# Patient Record
Sex: Female | Born: 1985 | Race: White | Hispanic: No | Marital: Married | Smoking: Never smoker
Health system: Southern US, Community
[De-identification: ages and names within clinical notes are randomized; demographics above are authoritative.]

## PROBLEM LIST (undated history)

## (undated) DIAGNOSIS — O99210 Obesity complicating pregnancy, unspecified trimester: Secondary | ICD-10-CM

## (undated) DIAGNOSIS — R112 Nausea with vomiting, unspecified: Secondary | ICD-10-CM

## (undated) DIAGNOSIS — D649 Anemia, unspecified: Secondary | ICD-10-CM

## (undated) DIAGNOSIS — F419 Anxiety disorder, unspecified: Secondary | ICD-10-CM

## (undated) DIAGNOSIS — F32A Depression, unspecified: Secondary | ICD-10-CM

## (undated) DIAGNOSIS — E282 Polycystic ovarian syndrome: Secondary | ICD-10-CM

## (undated) DIAGNOSIS — Z9889 Other specified postprocedural states: Secondary | ICD-10-CM

## (undated) HISTORY — DX: Polycystic ovarian syndrome: E28.2

## (undated) HISTORY — DX: Depression, unspecified: F32.A

## (undated) HISTORY — DX: Anxiety disorder, unspecified: F41.9

---
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
## 2008-05-29 ENCOUNTER — Emergency Department: Payer: Self-pay | Admitting: Emergency Medicine

## 2014-09-23 ENCOUNTER — Encounter: Payer: Self-pay | Admitting: *Deleted

## 2014-09-23 ENCOUNTER — Inpatient Hospital Stay
Admission: RE | Admit: 2014-09-23 | Discharge: 2014-09-29 | DRG: 765 | Disposition: A | Discharge status: Home / Self Care | Payer: Medicaid Other | Source: Ambulatory Visit | Attending: Obstetrics & Gynecology | Admitting: Obstetrics & Gynecology

## 2014-09-23 DIAGNOSIS — Z3490 Encounter for supervision of normal pregnancy, unspecified, unspecified trimester: Secondary | ICD-10-CM

## 2014-09-23 DIAGNOSIS — O99214 Obesity complicating childbirth: Secondary | ICD-10-CM | POA: Diagnosis present

## 2014-09-23 DIAGNOSIS — Z3A39 39 weeks gestation of pregnancy: Secondary | ICD-10-CM | POA: Diagnosis present

## 2014-09-23 DIAGNOSIS — Z6841 Body Mass Index (BMI) 40.0 and over, adult: Secondary | ICD-10-CM

## 2014-09-23 DIAGNOSIS — O99213 Obesity complicating pregnancy, third trimester: Secondary | ICD-10-CM | POA: Diagnosis present

## 2014-09-23 DIAGNOSIS — O339 Maternal care for disproportion, unspecified: Secondary | ICD-10-CM | POA: Diagnosis present

## 2014-09-23 HISTORY — DX: Obesity complicating pregnancy, unspecified trimester: O99.210

## 2014-09-23 LAB — CBC
HCT: 30.7 % — ABNORMAL LOW (ref 35.0–47.0)
Hemoglobin: 9.7 g/dL — ABNORMAL LOW (ref 12.0–16.0)
MCH: 25 pg — AB (ref 26.0–34.0)
MCHC: 31.7 g/dL — AB (ref 32.0–36.0)
MCV: 78.8 fL — ABNORMAL LOW (ref 80.0–100.0)
PLATELETS: 142 10*3/uL — AB (ref 150–440)
RBC: 3.9 MIL/uL (ref 3.80–5.20)
RDW: 18.4 % — ABNORMAL HIGH (ref 11.5–14.5)
WBC: 9 10*3/uL (ref 3.6–11.0)

## 2014-09-23 LAB — ABO/RH: ABO/RH(D): A NEG

## 2014-09-23 LAB — TYPE AND SCREEN
ABO/RH(D): A NEG
ANTIBODY SCREEN: NEGATIVE

## 2014-09-23 MED ORDER — MISOPROSTOL 200 MCG PO TABS
800.0000 ug | ORAL_TABLET | Freq: Once | ORAL | Status: DC | PRN
  Filled 2014-09-23: qty 4

## 2014-09-23 MED ORDER — LACTATED RINGERS IV SOLN: 500.0000 mL | INTRAVENOUS | Status: DC | PRN

## 2014-09-23 MED ORDER — LIDOCAINE HCL (PF) 1 % IJ SOLN
30.0000 mL | INTRAMUSCULAR | Status: DC | PRN
  Filled 2014-09-23: qty 30

## 2014-09-23 MED ORDER — AMMONIA AROMATIC IN INHA: 0.3000 mL | Freq: Once | RESPIRATORY_TRACT | Status: DC | PRN

## 2014-09-23 MED ORDER — ONDANSETRON HCL 4 MG/2ML IJ SOLN
4.0000 mg | Freq: Four times a day (QID) | INTRAMUSCULAR | Status: DC | PRN
  Administered 2014-09-25: 4 mg via INTRAVENOUS

## 2014-09-23 MED ORDER — ZOLPIDEM TARTRATE 5 MG PO TABS
5.0000 mg | ORAL_TABLET | Freq: Every evening | ORAL | Status: DC | PRN
  Administered 2014-09-23: 5 mg via ORAL

## 2014-09-23 MED ORDER — TERBUTALINE SULFATE 1 MG/ML IJ SOLN: 0.2500 mg | Freq: Once | INTRAMUSCULAR | Status: DC | PRN

## 2014-09-23 MED ORDER — OXYTOCIN BOLUS FROM INFUSION: 500.0000 mL | INTRAVENOUS | Status: DC

## 2014-09-23 MED ORDER — LACTATED RINGERS IV SOLN
INTRAVENOUS | Status: DC
  Administered 2014-09-23 – 2014-09-24 (×4): via INTRAVENOUS
  Administered 2014-09-25: 1000 mL via INTRAVENOUS

## 2014-09-23 MED ORDER — ZOLPIDEM TARTRATE 5 MG PO TABS
ORAL_TABLET | ORAL | Status: AC
  Administered 2014-09-23: 5 mg via ORAL
  Filled 2014-09-23: qty 1

## 2014-09-23 MED ORDER — OXYTOCIN 40 UNITS IN LACTATED RINGERS INFUSION - SIMPLE MED
62.5000 mL/h | INTRAVENOUS | Status: DC
  Administered 2014-09-25: 500 mL via INTRAVENOUS
  Administered 2014-09-25: 1000 mL via INTRAVENOUS
  Filled 2014-09-23: qty 1000

## 2014-09-23 MED ORDER — DINOPROSTONE 10 MG VA INST
10.0000 mg | VAGINAL_INSERT | Freq: Once | VAGINAL | Status: AC
  Administered 2014-09-23: 10 mg via VAGINAL
  Filled 2014-09-23: qty 1

## 2014-09-23 NOTE — H&P (Signed)
HISTORY AND PHYSICAL  HISTORY OF PRESENT ILLNESS: Tammy Chase is a 29 y.o. G1P0 with EDC=09/24/2014 at [redacted]w[redacted]d by LMP consistent with a 7 week ultrasound with a pregnancy complicated by obesity (BMI 44) presenting for induction of labor. Prenatal care remarkable also for UTIs , negative first trimester and MSAFP testing, receiving Rhogam at 28 weeks (A negative), normal antepartum testing and growth scans.  She has not had regular contractions and denies leakage of fluid, vaginal bleeding, or decreased fetal movement.     REVIEW OF SYSTEMS: A complete review of systems was performed and was specifically negative for headache, changes in vision, RUQ pain, shortness of breath, chest pain,  and dysuria.   HISTORY:  Past Medical History  Diagnosis Date  . Obesity affecting pregnancy     History reviewed. No pertinent past surgical history.  Current medications: Prenatal vitamins   No Known Allergies  OB History  Gravida Para Term Preterm AB SAB TAB Ectopic Multiple Living  1             # Outcome Date GA Lbr Len/2nd Weight Sex Delivery Anes PTL Lv  1 Current               Gynecologic History:  Pap Smear: NIL 09/25/2013 History of STI: Chlamydia 2009  Social History  Substance Use Topics  . Smoking status: Never Smoker   . Smokeless tobacco: Never Used  . Alcohol Use: No    PHYSICAL EXAM: Temp:  [98 F (36.7 C)] 98 F (36.7 C) (08/17 1934) Pulse Rate:  [87] 87 (08/17 1934) Resp:  [18] 18 (08/17 1934) BP: (122)/(74) 122/74 mmHg (08/17 1934) Weight:  [226 lb (102.513 kg)] 226 lb (102.513 kg) (08/17 1934)  GENERAL: NAD AAOx3 CHEST:CTAB no increased work of breathing CV:RRR no appreciable murmurs ABDOMEN: gravid, nontender, EFW 7 1/2#  by Leopolds EXTREMITIES:  Warm and well-perfused, nontender, mildly edematous, +1 DTRs  CERVIX: closed/ 70%/-1   FHT: 125-130 with accelerations to 150s, moderate variability, no decelerations Toco: occasional, mild  DIAGNOSTIC  STUDIES:  Recent Labs Lab 09/23/14 2120  WBC 9.0  HGB 9.7*  HCT 30.7*  PLT 142*    PRENATAL STUDIES:  Prenatal Labs:  MBT: A negative; Rubella immune, Varicella immune, HIV neg, RPR neg, Hep B neg, GC/CT neg, GBSneg, glucola 97/113  Last [place] [id number] 2556g (29.7%ile) placentafundal, AFI wnl, normal anatomy  ASSESSMENT AND PLAN: IUP at 39.6 weeks for IOL for obesity  1. Fetal Well being  - Fetal Tracing:Cat 1 - Group B Streptococcus:negative - Presentation: vtx confirmed by [place]  On admission  2. Routine OB: - Prenatal labs reviewed, as above - Rh negative  3. Induction of Labor:  -  Contractions mild and irregular with external toco in place -  Plan for induction with Cervidil- Cervidil inserted after explaining risks of hyperstimulation, FITL, failed induction, FTP, Cesarean section and informed consent obtained. 4. Post Partum Planning: - Infant feeding: Breast - Contraception: undecided  Tammy Chase, CNM

## 2014-09-24 MED ORDER — SODIUM CHLORIDE 0.9 % IJ SOLN
INTRAMUSCULAR | Status: AC
  Filled 2014-09-24: qty 6

## 2014-09-24 MED ORDER — MISOPROSTOL 25 MCG QUARTER TABLET
25.0000 ug | ORAL_TABLET | ORAL | Status: DC
  Administered 2014-09-24: 25 ug via ORAL
  Filled 2014-09-24: qty 1

## 2014-09-24 MED ORDER — STERILE WATER FOR INJECTION IJ SOLN
INTRAMUSCULAR | Status: AC
  Filled 2014-09-24: qty 50

## 2014-09-24 NOTE — Plan of Care (Signed)
Attempted to remove cervidil x 3. Unable to do so. t brothers,cnm notified

## 2014-09-24 NOTE — Plan of Care (Signed)
Foley bulb placement attempted by t brothers,cnm. Unable to place foley bulb. Will start cytotec

## 2014-09-24 NOTE — Progress Notes (Signed)
Intrapartum progress note  S:  Patient is comfortable, feeling contractions.  O:  BP 106/43 mmHg  Pulse 79  Temp(Src) 98 F (36.7 C) (Oral)  Resp 18   FHT: 140 mod +accels no decels TOCO: q32min SVE: 3/70/-1 soft, middle  A/P: 29yo G1P0 @ 40.0 with IOL due to maternal morbid obesity (BMI 42) 1. Fetal status: category 1 tracing 2. IOL: s/p cervadil and cytotec x1.  Now contracting spontaneously.  Will continue expectant management for now, and if contractions taper off will add pitocin.   3. Pain relief:  Epidural ok when patient requests. 4. Continuous EFM/TOCO  ----- Ranae Plumber, MD Attending Obstetrician and Gynecologist [facility]

## 2014-09-24 NOTE — Progress Notes (Signed)
Attempted to place foley bulb, unable to dilate bulb above cervical os. Procedure abandoned d/t pt's intolerance. Will plan for Cytotec po.

## 2014-09-24 NOTE — Progress Notes (Signed)
Called to bedside for cervidil removal - removed from vaginal vault.  Cervix 1/60/-2, vertex Category 1 tracing POM options reviewed with pt and s/o including foley bulb or cytotec. Pt opts for foley bulb, will place after pt has showered and had something to eat.

## 2014-09-24 NOTE — Plan of Care (Signed)
Cervidil removed by t brothers,cnm. Will allow pt to get up and shower

## 2014-09-25 ENCOUNTER — Inpatient Hospital Stay: Payer: Medicaid Other | Admitting: Registered Nurse

## 2014-09-25 ENCOUNTER — Inpatient Hospital Stay: Payer: Medicaid Other | Admitting: Certified Registered Nurse Anesthetist

## 2014-09-25 ENCOUNTER — Encounter
Admission: RE | Disposition: A | Discharge status: Home / Self Care | Payer: Self-pay | Source: Ambulatory Visit | Attending: Obstetrics & Gynecology

## 2014-09-25 ENCOUNTER — Encounter: Payer: Self-pay | Admitting: Anesthesiology

## 2014-09-25 LAB — PLATELET COUNT: PLATELETS: 137 10*3/uL — AB (ref 150–440)

## 2014-09-25 LAB — RPR: RPR Ser Ql: NONREACTIVE

## 2014-09-25 LAB — CREATININE, SERUM: CREATININE: 0.79 mg/dL (ref 0.44–1.00)

## 2014-09-25 SURGERY — Surgical Case
Anesthesia: Epidural | Wound class: Clean Contaminated

## 2014-09-25 MED ORDER — MAGNESIUM HYDROXIDE 400 MG/5ML PO SUSP: 30.0000 mL | ORAL | Status: DC | PRN

## 2014-09-25 MED ORDER — LIDOCAINE-EPINEPHRINE (PF) 1.5 %-1:200000 IJ SOLN
INTRAMUSCULAR | Status: DC | PRN
  Administered 2014-09-25: 3 mL via EPIDURAL

## 2014-09-25 MED ORDER — ACETAMINOPHEN 500 MG PO TABS
1000.0000 mg | ORAL_TABLET | Freq: Once | ORAL | Status: AC
  Administered 2014-09-25: 1000 mg via ORAL

## 2014-09-25 MED ORDER — DIPHENHYDRAMINE HCL 25 MG PO CAPS: 25.0000 mg | ORAL_CAPSULE | Freq: Four times a day (QID) | ORAL | Status: DC | PRN

## 2014-09-25 MED ORDER — LANOLIN HYDROUS EX OINT: 1.0000 "application " | TOPICAL_OINTMENT | CUTANEOUS | Status: DC | PRN

## 2014-09-25 MED ORDER — OXYTOCIN 40 UNITS IN LACTATED RINGERS INFUSION - SIMPLE MED
1.0000 m[IU]/min | INTRAVENOUS | Status: DC
  Administered 2014-09-25: 1 m[IU]/min via INTRAVENOUS

## 2014-09-25 MED ORDER — AMPICILLIN SODIUM 2 G IJ SOLR
2.0000 g | Freq: Four times a day (QID) | INTRAMUSCULAR | Status: DC
Start: 2014-09-25 — End: 2014-09-26
  Administered 2014-09-26 (×2): 2 g via INTRAVENOUS
  Filled 2014-09-25 (×4): qty 2000

## 2014-09-25 MED ORDER — DIBUCAINE 1 % RE OINT: 1.0000 "application " | TOPICAL_OINTMENT | RECTAL | Status: DC | PRN

## 2014-09-25 MED ORDER — PRENATAL MULTIVITAMIN CH
1.0000 | ORAL_TABLET | Freq: Every day | ORAL | Status: DC
  Administered 2014-09-26 – 2014-09-28 (×3): 1 via ORAL
  Filled 2014-09-25 (×4): qty 1

## 2014-09-25 MED ORDER — FERROUS SULFATE 325 (65 FE) MG PO TABS
325.0000 mg | ORAL_TABLET | Freq: Every day | ORAL | Status: DC
Start: 2014-09-26 — End: 2014-09-29
  Administered 2014-09-26 – 2014-09-29 (×4): 325 mg via ORAL
  Filled 2014-09-25 (×4): qty 1

## 2014-09-25 MED ORDER — BUTORPHANOL TARTRATE 1 MG/ML IJ SOLN
2.0000 mg | INTRAMUSCULAR | Status: DC | PRN
  Administered 2014-09-25 (×2): 2 mg via INTRAVENOUS
  Filled 2014-09-25: qty 2

## 2014-09-25 MED ORDER — ACETAMINOPHEN 500 MG PO TABS
ORAL_TABLET | ORAL | Status: AC
  Filled 2014-09-25: qty 2

## 2014-09-25 MED ORDER — LACTATED RINGERS IV SOLN
INTRAVENOUS | Status: DC
Start: 2014-09-25 — End: 2014-09-26
  Administered 2014-09-25 – 2014-09-26 (×2): via INTRAVENOUS

## 2014-09-25 MED ORDER — BUPIVACAINE HCL (PF) 0.25 % IJ SOLN
INTRAMUSCULAR | Status: DC | PRN
  Administered 2014-09-25: 4 mL

## 2014-09-25 MED ORDER — FENTANYL CITRATE (PF) 100 MCG/2ML IJ SOLN
INTRAMUSCULAR | Status: AC
  Administered 2014-09-25: 25 ug
  Filled 2014-09-25: qty 2

## 2014-09-25 MED ORDER — CLINDAMYCIN PHOSPHATE 900 MG/50ML IV SOLN
INTRAVENOUS | Status: AC
  Filled 2014-09-25: qty 50

## 2014-09-25 MED ORDER — FENTANYL 2.5 MCG/ML W/ROPIVACAINE 0.2% IN NS 100 ML EPIDURAL INFUSION (ARMC-ANES)
EPIDURAL | Status: AC
  Administered 2014-09-25: 10 mL/h via EPIDURAL
  Filled 2014-09-25: qty 100

## 2014-09-25 MED ORDER — HYDROMORPHONE HCL 1 MG/ML IJ SOLN
0.5000 mg | INTRAMUSCULAR | Status: DC | PRN
  Administered 2014-09-25 (×2): 0.5 mg via INTRAVENOUS
  Filled 2014-09-25 (×2): qty 1

## 2014-09-25 MED ORDER — MENTHOL 3 MG MT LOZG
1.0000 | LOZENGE | OROMUCOSAL | Status: DC | PRN
  Filled 2014-09-25: qty 9

## 2014-09-25 MED ORDER — CLINDAMYCIN PHOSPHATE 900 MG/50ML IV SOLN
900.0000 mg | Freq: Three times a day (TID) | INTRAVENOUS | Status: AC
  Administered 2014-09-25 – 2014-09-26 (×3): 900 mg via INTRAVENOUS
  Filled 2014-09-25 (×2): qty 50

## 2014-09-25 MED ORDER — FENTANYL CITRATE (PF) 100 MCG/2ML IJ SOLN
25.0000 ug | INTRAMUSCULAR | Status: DC | PRN
  Administered 2014-09-25 (×4): 25 ug via INTRAVENOUS

## 2014-09-25 MED ORDER — OXYCODONE-ACETAMINOPHEN 5-325 MG PO TABS
2.0000 | ORAL_TABLET | Freq: Four times a day (QID) | ORAL | Status: DC | PRN
  Administered 2014-09-26 – 2014-09-28 (×5): 2 via ORAL
  Filled 2014-09-25 (×5): qty 2

## 2014-09-25 MED ORDER — WITCH HAZEL-GLYCERIN EX PADS: 1.0000 "application " | MEDICATED_PAD | CUTANEOUS | Status: DC | PRN

## 2014-09-25 MED ORDER — TETANUS-DIPHTH-ACELL PERTUSSIS 5-2.5-18.5 LF-MCG/0.5 IM SUSP: 0.5000 mL | Freq: Once | INTRAMUSCULAR | Status: DC

## 2014-09-25 MED ORDER — FENTANYL CITRATE (PF) 100 MCG/2ML IJ SOLN
INTRAMUSCULAR | Status: DC | PRN
  Administered 2014-09-25 (×2): 50 ug via INTRAVENOUS

## 2014-09-25 MED ORDER — BUTORPHANOL TARTRATE 1 MG/ML IJ SOLN
INTRAMUSCULAR | Status: AC
  Administered 2014-09-25: 2 mg via INTRAVENOUS
  Filled 2014-09-25: qty 2

## 2014-09-25 MED ORDER — IBUPROFEN 600 MG PO TABS
600.0000 mg | ORAL_TABLET | Freq: Four times a day (QID) | ORAL | Status: DC
  Administered 2014-09-26 – 2014-09-29 (×12): 600 mg via ORAL
  Filled 2014-09-25 (×12): qty 1

## 2014-09-25 MED ORDER — POLYETHYLENE GLYCOL 3350 17 G PO PACK
17.0000 g | PACK | Freq: Every day | ORAL | Status: DC
  Administered 2014-09-27 – 2014-09-28 (×2): 17 g via ORAL
  Filled 2014-09-25 (×2): qty 1

## 2014-09-25 MED ORDER — SIMETHICONE 80 MG PO CHEW
80.0000 mg | CHEWABLE_TABLET | Freq: Two times a day (BID) | ORAL | Status: DC
  Administered 2014-09-26 – 2014-09-29 (×7): 80 mg via ORAL
  Filled 2014-09-25 (×7): qty 1

## 2014-09-25 MED ORDER — ACETAMINOPHEN 325 MG PO TABS: 650.0000 mg | ORAL_TABLET | ORAL | Status: DC | PRN

## 2014-09-25 MED ORDER — GENTAMICIN SULFATE 40 MG/ML IJ SOLN
1.5000 mg/kg | Freq: Three times a day (TID) | INTRAVENOUS | Status: DC
  Administered 2014-09-25: 150 mg via INTRAVENOUS
  Filled 2014-09-25 (×4): qty 3.75

## 2014-09-25 MED ORDER — DEXTROSE 5 % IV SOLN
90.0000 mg | Freq: Three times a day (TID) | INTRAVENOUS | Status: DC
  Administered 2014-09-25 – 2014-09-26 (×2): 90 mg via INTRAVENOUS
  Filled 2014-09-25 (×6): qty 2.25

## 2014-09-25 MED ORDER — CITRIC ACID-SODIUM CITRATE 334-500 MG/5ML PO SOLN
ORAL | Status: AC
  Administered 2014-09-25: 14:00:00 via ORAL
  Filled 2014-09-25: qty 15

## 2014-09-25 MED ORDER — MORPHINE SULFATE (PF) 0.5 MG/ML IJ SOLN
INTRAMUSCULAR | Status: DC | PRN
  Administered 2014-09-25: .1 mg via EPIDURAL

## 2014-09-25 MED ORDER — AMPICILLIN SODIUM 2 G IJ SOLR
2.0000 g | Freq: Four times a day (QID) | INTRAMUSCULAR | Status: DC
  Administered 2014-09-25: 2 g via INTRAVENOUS
  Filled 2014-09-25: qty 2000

## 2014-09-25 MED ORDER — OXYTOCIN 40 UNITS IN LACTATED RINGERS INFUSION - SIMPLE MED: 62.5000 mL/h | INTRAVENOUS | Status: DC

## 2014-09-25 MED ORDER — TERBUTALINE SULFATE 1 MG/ML IJ SOLN: 0.2500 mg | Freq: Once | INTRAMUSCULAR | Status: DC | PRN

## 2014-09-25 MED ORDER — ONDANSETRON HCL 4 MG/2ML IJ SOLN: 4.0000 mg | Freq: Once | INTRAMUSCULAR | Status: DC | PRN

## 2014-09-25 MED ORDER — CITRIC ACID-SODIUM CITRATE 334-500 MG/5ML PO SOLN: 30.0000 mL | ORAL | Status: DC

## 2014-09-25 MED ORDER — GENTAMICIN SULFATE 40 MG/ML IJ SOLN
180.0000 mg | Freq: Once | INTRAVENOUS | Status: AC
  Administered 2014-09-25: 180 mg via INTRAVENOUS
  Filled 2014-09-25: qty 4.5

## 2014-09-25 MED ORDER — CEFAZOLIN SODIUM-DEXTROSE 2-3 GM-% IV SOLR: 2.0000 g | INTRAVENOUS | Status: DC

## 2014-09-25 MED ORDER — OXYCODONE-ACETAMINOPHEN 5-325 MG PO TABS
1.0000 | ORAL_TABLET | Freq: Four times a day (QID) | ORAL | Status: DC | PRN
  Administered 2014-09-26 – 2014-09-29 (×7): 1 via ORAL
  Filled 2014-09-25 (×7): qty 1

## 2014-09-25 SURGICAL SUPPLY — 31 items
CANISTER SUCT 3000ML (MISCELLANEOUS) ×3 IMPLANT
CHLORAPREP W/TINT 26ML (MISCELLANEOUS) ×6 IMPLANT
CLOSURE WOUND 1/2 X4 (GAUZE/BANDAGES/DRESSINGS) ×1
DRSG TEGADERM 6X8 (GAUZE/BANDAGES/DRESSINGS) ×6 IMPLANT
DRSG TELFA 3X8 NADH (GAUZE/BANDAGES/DRESSINGS) ×3 IMPLANT
ELECT CAUTERY BLADE 6.4 (BLADE) ×3 IMPLANT
GAUZE SPONGE 4X4 12PLY STRL (GAUZE/BANDAGES/DRESSINGS) ×3 IMPLANT
GLOVE BIO SURGEON STRL SZ7 (GLOVE) ×3 IMPLANT
GLOVE INDICATOR 7.5 STRL GRN (GLOVE) ×3 IMPLANT
GOWN STRL REUS W/ TWL LRG LVL3 (GOWN DISPOSABLE) ×1 IMPLANT
GOWN STRL REUS W/ TWL XL LVL3 (GOWN DISPOSABLE) ×2 IMPLANT
GOWN STRL REUS W/TWL LRG LVL3 (GOWN DISPOSABLE) ×2
GOWN STRL REUS W/TWL XL LVL3 (GOWN DISPOSABLE) ×4
LIQUID BAND (GAUZE/BANDAGES/DRESSINGS) ×3 IMPLANT
NS IRRIG 1000ML POUR BTL (IV SOLUTION) ×3 IMPLANT
PACK C SECTION AR (MISCELLANEOUS) ×3 IMPLANT
PAD GROUND ADULT SPLIT (MISCELLANEOUS) ×3 IMPLANT
PAD OB MATERNITY 4.3X12.25 (PERSONAL CARE ITEMS) ×3 IMPLANT
PAD PREP 24X41 OB/GYN DISP (PERSONAL CARE ITEMS) ×3 IMPLANT
SPONGE LAP 18X18 5 PK (GAUZE/BANDAGES/DRESSINGS) ×3 IMPLANT
STRIP CLOSURE SKIN 1/2X4 (GAUZE/BANDAGES/DRESSINGS) ×2 IMPLANT
SUT MAXON ABS #0 GS21 30IN (SUTURE) ×6 IMPLANT
SUT MNCRL AB 4-0 PS2 18 (SUTURE) ×3 IMPLANT
SUT MON AB 2-0 CT1 36 (SUTURE) ×3 IMPLANT
SUT PLAIN 2 0 (SUTURE) ×4
SUT PLAIN 2 0 XLH (SUTURE) ×6 IMPLANT
SUT PLAIN ABS 2-0 CT1 27XMFL (SUTURE) ×2 IMPLANT
SUT VIC AB 1 CT1 36 (SUTURE) ×6 IMPLANT
SUT VIC AB 2-0 CT1 36 (SUTURE) ×3 IMPLANT
SUT VIC AB 4-0 FS2 27 (SUTURE) ×3 IMPLANT
SYRINGE 10CC LL (SYRINGE) ×3 IMPLANT

## 2014-09-25 NOTE — Discharge Summary (Signed)
Obstetrical Discharge Summary  Date of Admission: 09/23/2014 Date of Discharge: 09/29/2014  Primary OB: [facility]  Gestational Age at Delivery: [redacted]w[redacted]d   Antepartum complications: BMI 44, Rh negative, multiple UTIs Reason for Admission: Induction for BMI >40 Date of Delivery: 09/25/2014  Delivered By: [name]. MD Delivery Type: primary cesarean section, low transverse incision Intrapartum complications/course: Chorio, persistent category II tracing, remote from delivery Anesthesia: epidural Placenta: expressed. Intact: yes. To pathology: yes.  Laceration: none Episiotomy: none Baby: Liveborn female, APGARs8/9, weight 3320 g.   Postpartum course: uncomplicated. Patient received 24hrs of abx post partum Disposition: Home  Rh Immune globulin given: yes Rubella vaccine given: not applicable Tdap vaccine given in AP or PP setting: no Flu vaccine given in AP or PP setting: not applicable  Contraception: patient undecided  Prenatal/Postnatal Panel: A NEG//Rubella Immune//Varicella Immune//RPR negative//HIV negative/HepB Surface Ag negative//pap neg but no EC or TZ seen (date: 2015)//plans to breastfeed  Plan:  Tammy Chase was discharged to home in good condition. Follow-up appointment with Dr Vergie Living in 1 week for an incision check  Discharge Medications:   Medication List    TAKE these medications        ferrous sulfate 325 (65 FE) MG tablet  Take 1 tablet (325 mg total) by mouth 2 (two) times daily with a meal.     ibuprofen 600 MG tablet  Commonly known as:  ADVIL,MOTRIN  Take 1 tablet (600 mg total) by mouth every 6 (six) hours as needed for mild pain or cramping.     multivitamin-prenatal 27-0.8 MG Tabs tablet  Take 1 tablet by mouth daily at 12 noon.     oxyCODONE-acetaminophen 5-325 MG per tablet  Commonly known as:  PERCOCET/ROXICET  Take 1 tablet by mouth every 6 (six) hours as needed for severe pain.     polyethylene glycol packet  Commonly known  as:  MIRALAX / GLYCOLAX  Take 17 g by mouth daily.       [name] MD [facility]  Pager: [phone]

## 2014-09-25 NOTE — Progress Notes (Signed)
L&D Note  09/25/2014 - 8:50 AM  29 y.o. G1 [redacted]w[redacted]d (LMP=7). Pregnancy complicated by BMI 42, Rh negative, h/o UTI  Tammy Chase is admitted for IOL for BMI   Subjective:  Desires epidural  Objective:    Current Vital Signs 24h Vital Sign Ranges  T 97.9 F (36.6 C) Temp  Avg: 98.1 F (36.7 C)  Min: 97.9 F (36.6 C)  Max: 98.2 F (36.8 C)  BP 115/65 mmHg BP  Min: 91/43  Max: 161/142  HR 77 Pulse  Avg: 76.8  Min: 68  Max: 83  RR 18 Resp  Avg: 18  Min: 18  Max: 18  SaO2     No Data Recorded       24 Hour I/O Current Shift I/O  Time Ins Outs       FHR: 135 baseline, occasional accels, rare slight variables, mod var Toco: q2-17m Gen: NAD SVE: 3/70/-1 by Dr. Elesa Massed at 0700.  Labs:   Recent Labs Lab 09/23/14 2120 09/25/14 0821  WBC 9.0  --   HGB 9.7*  --   HCT 30.7*  --   PLT 142* 137*   Medications Current Facility-Administered Medications  Medication Dose Route Frequency Provider Last Rate Last Dose  . fentanyl 2.5 mcg/ml w/ropivacaine 0.2% in normal saline 100 mL EPIDURAL Infusion 2 mcg/ml           . ammonia inhalant 0.3 mL  0.3 mL Inhalation Once PRN Farrel Conners, CNM      . butorphanol (STADOL) injection 2 mg  2 mg Intravenous Q2H PRN Elenora Fender Ward, MD   2 mg at 09/25/14 0607  . lactated ringers infusion 500-1,000 mL  500-1,000 mL Intravenous PRN Farrel Conners, CNM      . lactated ringers infusion   Intravenous Continuous Farrel Conners, CNM 125 mL/hr at 09/24/14 2354    . lidocaine (PF) (XYLOCAINE) 1 % injection 30 mL  30 mL Subcutaneous PRN Farrel Conners, CNM      . misoprostol (CYTOTEC) tablet 25 mcg  25 mcg Oral 6 times per day Marta Antu, CNM   25 mcg at 09/24/14 1407  . misoprostol (CYTOTEC) tablet 800 mcg  800 mcg Rectal Once PRN Farrel Conners, CNM      . ondansetron (ZOFRAN) injection 4 mg  4 mg Intravenous Q6H PRN Farrel Conners, CNM      . oxytocin (PITOCIN) IV BOLUS FROM BAG  500 mL Intravenous Continuous Farrel Conners,  CNM   Stopped at 09/23/14 2146  . oxytocin (PITOCIN) IV infusion 40 units in LR 1000 mL  62.5 mL/hr Intravenous Continuous Farrel Conners, CNM   Stopped at 09/23/14 2146  . oxytocin (PITOCIN) IV infusion 40 units in LR 1000 mL  1-40 milli-units/min Intravenous Titrated Elenora Fender Ward, MD 30 mL/hr at 09/25/14 0705 20 milli-units/min at 09/25/14 0705  . terbutaline (BRETHINE) injection 0.25 mg  0.25 mg Subcutaneous Once PRN Farrel Conners, CNM      . terbutaline (BRETHINE) injection 0.25 mg  0.25 mg Subcutaneous Once PRN Chelsea C Ward, MD      . zolpidem (AMBIEN) tablet 5 mg  5 mg Oral QHS PRN Farrel Conners, CNM   5 mg at 09/23/14 2201    Assessment & Plan:  Pt doing well *IUP: category I tracing currently *IOL: continue pit per protocol, currently at max of 20. -s/p SROM at 0600 today (clear) -Leopolds: 3700gm; 6/21: 53%, normal AC *GBS: neg *Analgesia: anesthesia aware  [name] MD [facility] Pager  [phone]  

## 2014-09-25 NOTE — Progress Notes (Signed)
L&D Note  09/25/2014 - 11:12 AM  29 y.o. G1 [redacted]w[redacted]d (LMP=7). Pregnancy complicated by BMI 42, Rh negative, h/o UTI  [name] is admitted for IOL for BMI   Subjective:  Comfortable s/p epidural placement  Objective:    Current Vital Signs 24h Vital Sign Ranges  T 98.5 F (36.9 C) Temp  Avg: 98.2 F (36.8 C)  Min: 97.9 F (36.6 C)  Max: 98.5 F (36.9 C)  BP (!) 98/47 mmHg BP  Min: 81/33  Max: 161/142  HR 76 Pulse  Avg: 77.2  Min: 68  Max: 83  RR 18 Resp  Avg: 18  Min: 18  Max: 18  SaO2    98/RA No Data Recorded       24 Hour I/O Current Shift I/O  Time Ins Outs       FHR: 135 baseline, occasional accels, rare slight variables, mod var Toco: hard to trace Gen: NAD SVE: IUPC placed; 3-4/75/-2  Recent Labs Lab 09/23/14 2120 09/25/14 0821  WBC 9.0  --   HGB 9.7*  --   HCT 30.7*  --   PLT 142* 137*   Medications Current Facility-Administered Medications  Medication Dose Route Frequency Provider Last Rate Last Dose  . ammonia inhalant 0.3 mL  0.3 mL Inhalation Once PRN Farrel Conners, CNM      . butorphanol (STADOL) injection 2 mg  2 mg Intravenous Q2H PRN Elenora Fender Ward, MD   2 mg at 09/25/14 0607  . lactated ringers infusion 500-1,000 mL  500-1,000 mL Intravenous PRN Farrel Conners, CNM      . lactated ringers infusion   Intravenous Continuous Farrel Conners, CNM 125 mL/hr at 09/24/14 2354    . lidocaine (PF) (XYLOCAINE) 1 % injection 30 mL  30 mL Subcutaneous PRN Farrel Conners, CNM      . misoprostol (CYTOTEC) tablet 25 mcg  25 mcg Oral 6 times per day Marta Antu, CNM   25 mcg at 09/24/14 1407  . misoprostol (CYTOTEC) tablet 800 mcg  800 mcg Rectal Once PRN Farrel Conners, CNM      . ondansetron (ZOFRAN) injection 4 mg  4 mg Intravenous Q6H PRN Farrel Conners, CNM      . oxytocin (PITOCIN) IV BOLUS FROM BAG  500 mL Intravenous Continuous Farrel Conners, CNM   Stopped at 09/23/14 2146  . oxytocin (PITOCIN) IV infusion 40 units in LR  1000 mL  62.5 mL/hr Intravenous Continuous Farrel Conners, CNM   Stopped at 09/23/14 2146  . oxytocin (PITOCIN) IV infusion 40 units in LR 1000 mL  1-40 milli-units/min Intravenous Titrated Elenora Fender Ward, MD 30 mL/hr at 09/25/14 0705 20 milli-units/min at 09/25/14 0705  . terbutaline (BRETHINE) injection 0.25 mg  0.25 mg Subcutaneous Once PRN Farrel Conners, CNM      . terbutaline (BRETHINE) injection 0.25 mg  0.25 mg Subcutaneous Once PRN Chelsea C Ward, MD      . zolpidem (AMBIEN) tablet 5 mg  5 mg Oral QHS PRN Farrel Conners, CNM   5 mg at 09/23/14 2201   Facility-Administered Medications Ordered in Other Encounters  Medication Dose Route Frequency Provider Last Rate Last Dose  . bupivacaine (PF) (MARCAINE) 0.25 % injection    Anesthesia Intra-op Stormy Fabian, CRNA   4 mL at 09/25/14 0918  . lidocaine-EPINEPHrine 1.5 %-1:200000 injection    Anesthesia Intra-op Stormy Fabian, CRNA   3 mL at 09/25/14 0914    Assessment & Plan:  Pt doing well *IUP: category I tracing  currently *IOL: continue pit per protocol, currently at max of 20. Will be able to titrate better with IUPC place; pt was amenable to placement -s/p SROM at 0600 today (clear) -Leopolds: 3700gm; 6/21: 53%, normal AC *GBS: neg *Analgesia: s/p epidural placement  [name] MD [facility] Pager [phone]

## 2014-09-25 NOTE — Progress Notes (Signed)
L&D Note  09/25/2014 - 12:58 PM  29 y.o. G1 [redacted]w[redacted]d (LMP=7). Pregnancy complicated by BMI 42, Rh negative, h/o UTI  Ms. Quinne Pires is admitted for IOL for BMI   Subjective:  Comfortable s/p epidural placement. Variables better after d/c pit and amnioinfusion Objective:    Current Vital Signs 24h Vital Sign Ranges  T 99.3 F (37.4 C) Temp  Avg: 98.4 F (36.9 C)  Min: 97.9 F (36.6 C)  Max: 99.3 F (37.4 C)  BP (!) 97/57 mmHg BP  Min: 81/33  Max: 161/142  HR 81 Pulse  Avg: 77.5  Min: 68  Max: 83  RR 18 Resp  Avg: 18  Min: 18  Max: 18  SaO2    98/RA No Data Recorded       24 Hour I/O Current Shift I/O  Time Ins Outs       FHR: 165 baseline, +accels, occasional variables, mod var Toco: q3-49m Gen: NAD SVE: deffered  Recent Labs Lab 09/23/14 2120 09/25/14 0821  WBC 9.0  --   HGB 9.7*  --   HCT 30.7*  --   PLT 142* 137*   Medications Current Facility-Administered Medications  Medication Dose Route Frequency Provider Last Rate Last Dose  . ammonia inhalant 0.3 mL  0.3 mL Inhalation Once PRN Farrel Conners, CNM      . butorphanol (STADOL) injection 2 mg  2 mg Intravenous Q2H PRN Elenora Fender Ward, MD   2 mg at 09/25/14 0607  . lactated ringers infusion 500-1,000 mL  500-1,000 mL Intravenous PRN Farrel Conners, CNM      . lactated ringers infusion   Intravenous Continuous Farrel Conners, CNM 125 mL/hr at 09/25/14 1143 1,000 mL at 09/25/14 1143  . lidocaine (PF) (XYLOCAINE) 1 % injection 30 mL  30 mL Subcutaneous PRN Farrel Conners, CNM      . misoprostol (CYTOTEC) tablet 25 mcg  25 mcg Oral 6 times per day Marta Antu, CNM   25 mcg at 09/24/14 1407  . misoprostol (CYTOTEC) tablet 800 mcg  800 mcg Rectal Once PRN Farrel Conners, CNM      . ondansetron (ZOFRAN) injection 4 mg  4 mg Intravenous Q6H PRN Farrel Conners, CNM      . oxytocin (PITOCIN) IV BOLUS FROM BAG  500 mL Intravenous Continuous Farrel Conners, CNM   Stopped at 09/23/14 2146  . oxytocin  (PITOCIN) IV infusion 40 units in LR 1000 mL  62.5 mL/hr Intravenous Continuous Farrel Conners, CNM   Stopped at 09/23/14 2146  . oxytocin (PITOCIN) IV infusion 40 units in LR 1000 mL  1-40 milli-units/min Intravenous Titrated Elenora Fender Ward, MD 19.5 mL/hr at 09/25/14 1113 13 milli-units/min at 09/25/14 1113  . terbutaline (BRETHINE) injection 0.25 mg  0.25 mg Subcutaneous Once PRN Farrel Conners, CNM      . terbutaline (BRETHINE) injection 0.25 mg  0.25 mg Subcutaneous Once PRN Chelsea C Ward, MD      . zolpidem (AMBIEN) tablet 5 mg  5 mg Oral QHS PRN Farrel Conners, CNM   5 mg at 09/23/14 2201   Facility-Administered Medications Ordered in Other Encounters  Medication Dose Route Frequency Provider Last Rate Last Dose  . bupivacaine (PF) (MARCAINE) 0.25 % injection    Anesthesia Intra-op Stormy Fabian, CRNA   4 mL at 09/25/14 0918  . lidocaine-EPINEPHrine 1.5 %-1:200000 injection    Anesthesia Intra-op Stormy Fabian, CRNA   3 mL at 09/25/14 0914    Assessment & Plan:  Pt doing well *IUP:  category II tracing; will re bolus with IVF another 1L. Recent temp normal *IOL: may start pit again once category II tracing resolves; reassess cervix in two hours and if no change, may need to d/w pt regading c-section -s/p SROM at 0600 today (clear) -Leopolds: 3700gm; 6/21: 53%, normal AC *GBS: neg *Analgesia: s/p epidural placement  [name] MD [facility] Pager [phone]

## 2014-09-25 NOTE — Discharge Instructions (Signed)
° °Cesarean Delivery, Care After °Refer to this sheet in the next few weeks. These instructions provide you with information on caring for yourself after your procedure. Your health care provider may also give you specific instructions. Your treatment has been planned according to current medical practices, but problems sometimes occur. Call your health care provider if you have any problems or questions after you go home. °HOME CARE INSTRUCTIONS  °· If you have an On-Q pump, remove it on the 5th day after your surgery, by removing the dressing/bandage and pulling the pump out. Cover the site where the pump strings came out with a band-aid, as needed. °· Only take over-the-counter or prescription medications as directed by your health care provider. °· Do not drink alcohol, especially if you are breastfeeding or taking medication to relieve pain. °· Do not  smoke tobacco. °· Continue to use good perineal care. Good perineal care includes: °¨ Wiping your perineum from front to back. °¨ Keeping your perineum clean. °· Check your surgical cut (incision) daily for increased redness, drainage, swelling, or separation of skin. °· Shower and clean your incision gently with soap and water every day, by letting warm and soapy water run over the incision, and then pat it dry. If your health care provider says it is okay, leave the incision uncovered. Use a bandage (dressing) if the incision is draining fluid or appears irritated. If the adhesive strips across the incision do not fall off within 7 days, carefully peel them off, after a shower. °· Hug a pillow when coughing or sneezing until your incision is healed. This helps to relieve pain. °· Do not use tampons, douches or have sexual intercourse, until your health care provider says it is okay. °· Wear a well-fitting bra that provides breast support. °· Limit wearing support panties or control-top hose. °· Drink enough fluids to keep your urine clear or pale  yellow. °· Eat high-fiber foods such as whole grain cereals and breads, brown rice, beans, and fresh fruits and vegetables every day. These foods may help prevent or relieve constipation. °· Resume activities such as climbing stairs, driving, lifting, exercising, or traveling as directed by your health care provider. °· Try to have someone help you with your household activities and your newborn for at least a few days after you leave the hospital. °· Rest as much as possible. Try to rest or take a nap when your newborn is sleeping. °· Increase your activities gradually. °· Do not lift more than 15lbs until directed by a provider. °· Keep all of your scheduled postpartum appointments. It is very important to keep your scheduled follow-up appointments. At these appointments, your health care provider will be checking to make sure that you are healing physically and emotionally. °SEEK MEDICAL CARE IF:  °· You are passing large clots from your vagina. Save any clots to show your health care provider. °· You have a foul smelling discharge from your vagina. °· You have trouble urinating. °· You are urinating frequently. °· You have pain when you urinate. °· You have a change in your bowel movements. °· You have increasing redness, pain, or swelling near your incision. °· You have pus draining from your incision. °· Your incision is separating. °· You have painful, hard, or reddened breasts. °· You have a severe headache. °· You have blurred vision or see spots. °· You feel sad or depressed. °· You have thoughts of hurting yourself or your newborn. °· You have questions about your   care, the care of your newborn, or medications. °· You are dizzy or light-headed. °· You have a rash. °· You have pain, redness, or swelling at the site of the removed intravenous access (IV) tube. °· You have nausea or vomiting. °· You stopped breastfeeding and have not had a menstrual period within 12 weeks of stopping. °· You are not  breastfeeding and have not had a menstrual period within 12 weeks of delivery. °· You have a fever. °SEEK IMMEDIATE MEDICAL CARE IF: °· You have persistent pain. °· You have chest pain. °· You have shortness of breath. °· You faint. °· You have leg pain. °· You have stomach pain. °· Your vaginal bleeding saturates 2 or more sanitary pads in 1 hour. °MAKE SURE YOU:  °· Understand these instructions. °· Will watch your condition. °· Will get help right away if you are not doing well or get worse. °Document Released: 10/15/2001 Document Revised: 06/09/2013 Document Reviewed: 09/20/2011 °[employer]® Patient Information ©2015 [employer]. This information is not intended to replace advice given to you by your health care provider. Make sure you discuss any questions you have with your health care provider. ° ° °

## 2014-09-25 NOTE — Anesthesia Preprocedure Evaluation (Addendum)
Anesthesia Evaluation  Patient identified by MRN, date of birth, ID band Patient awake    Reviewed: Allergy & Precautions, H&P , NPO status , Patient's Chart, lab work & pertinent test results  History of Anesthesia Complications Negative for: history of anesthetic complications  Airway Mallampati: II  TM Distance: >3 FB Neck ROM: full    Dental no notable dental hx.    Pulmonary neg pulmonary ROS,    Pulmonary exam normal       Cardiovascular negative cardio ROS Normal cardiovascular exam    Neuro/Psych negative neurological ROS     GI/Hepatic negative GI ROS, Neg liver ROS,   Endo/Other  negative endocrine ROSMorbid obesity  Renal/GU negative Renal ROS  negative genitourinary   Musculoskeletal   Abdominal   Peds  Hematology negative hematology ROS (+)   Anesthesia Other Findings 1335 Called for C-section.  Reproductive/Obstetrics (+) Pregnancy                           Anesthesia Physical Anesthesia Plan  ASA: II  Anesthesia Plan: Epidural   Post-op Pain Management:    Induction:   Airway Management Planned:   Additional Equipment:   Intra-op Plan:   Post-operative Plan:   Informed Consent:   Plan Discussed with: Anesthesiologist  Anesthesia Plan Comments:         Anesthesia Quick Evaluation

## 2014-09-25 NOTE — Anesthesia Procedure Notes (Signed)
Epidural Patient location during procedure: OB Start time: 09/25/2014 9:08 AM End time: 09/25/2014 9:12 AM  Staffing Resident/CRNA: Stormy Fabian  Preanesthetic Checklist Completed: patient identified, site marked, surgical consent, pre-op evaluation, timeout performed, IV checked, risks and benefits discussed and monitors and equipment checked  Epidural Patient position: sitting Prep: Betadine Patient monitoring: heart rate, continuous pulse ox and blood pressure Approach: midline Location: L3-L4 Injection technique: LOR saline  Needle:  Needle type: Tuohy  Needle gauge: 18 G Needle length: 9 cm and 9 Needle insertion depth: 7.5 cm Catheter type: closed end flexible Catheter size: 20 Guage Catheter at skin depth: 11 cm Test dose: negative and 1.5% lidocaine with Epi 1:200 K  Assessment Events: blood not aspirated, injection not painful, no injection resistance, negative IV test and no paresthesia  Additional Notes   Patient tolerated the insertion well without complications.Reason for block:procedure for pain

## 2014-09-25 NOTE — Transfer of Care (Signed)
Immediate Anesthesia Transfer of Care Note  Patient: Tammy Chase  Procedure(s) Performed: Procedure(s): CESAREAN SECTION (N/A)  Patient Location: [facility]  Anesthesia Type:Epidural  Level of Consciousness: awake, alert  and oriented  Airway & Oxygen Therapy: Patient Spontanous Breathing  Post-op Assessment: Report given to RN and Post -op Vital signs reviewed and stable  Post vital signs: Reviewed and stable  Last Vitals:  Filed Vitals:   09/25/14 1509  BP: 120/63  Pulse: 79  Temp: 37.3 C  Resp: 18    Complications: No apparent anesthesia complications

## 2014-09-25 NOTE — Progress Notes (Signed)
OB Note Two variables with last one down for 36m. Still tachy and contracting q3-40m sve unchanged. D/w mom regarding that I feel it's best to call for c-section. She is amenable to this. OR aware.  [name] MD [facility]  Pager: [phone]

## 2014-09-25 NOTE — Progress Notes (Signed)
ANTIBIOTIC CONSULT NOTE - INITIAL  Pharmacy Consult for Gentamicin Indication: chorioamnionitis  No Known Allergies  Patient Measurements: Height:  (154.9 cm) Weight: 226 lb (102.513 kg) IBW/kg (Calculated) : 47.8 Adjusted Body Weight: 69.7 kg  Vital Signs: Temp: 101.4 F (38.6 C) (08/19 1340) Temp Source: Oral (08/19 1340) BP: 97/57 mmHg (08/19 1155) Pulse Rate: 87 (08/19 1414) Intake/Output from previous day:   Intake/Output from this shift: Total I/O In: 600 [I.V.:600] Out: -   Labs:  Recent Labs  09/23/14 2120 09/25/14 0821  WBC 9.0  --   HGB 9.7*  --   PLT 142* 137*  CREATININE  --  0.79   Estimated Creatinine Clearance: 116.2 mL/min (by C-G formula based on Cr of 0.79). No results for input(s): VANCOTROUGH, VANCOPEAK, VANCORANDOM, GENTTROUGH, GENTPEAK, GENTRANDOM, TOBRATROUGH, TOBRAPEAK, TOBRARND, AMIKACINPEAK, AMIKACINTROU, AMIKACIN in the last 72 hours.   Kinetics:   Ke: 0.242  Vd: 20.9   Microbiology: No results found for this or any previous visit (from the past 720 hour(s)).  Medical History: Past Medical History  Diagnosis Date  . Obesity affecting pregnancy     Medications:  Scheduled:  . acetaminophen      . ampicillin (OMNIPEN) IV  2 g Intravenous 4 times per day  .  ceFAZolin (ANCEF) IV  2 g Intravenous 30 min Pre-Op  . citric acid-sodium citrate  30 mL Oral 30 min Pre-Op  . clindamycin      . clindamycin (CLEOCIN) IV  900 mg Intravenous 3 times per day  . gentamicin  180 mg Intravenous Once  . gentamicin  90 mg Intravenous Q8H  . misoprostol  25 mcg Oral 6 times per day   Infusions:  . lactated ringers 1,000 mL (09/25/14 1143)  . oxytocin 40 units in LR 1000 mL Stopped (09/23/14 2146)  . oxytocin 40 units in LR 1000 mL Stopped (09/23/14 2146)  . oxytocin 40 units in LR 1000 mL 13 milli-units/min (09/25/14 1113)   PRN: ammonia, butorphanol, lactated ringers, lidocaine (PF), misoprostol, ondansetron, terbutaline, terbutaline,  zolpidem  Assessment: 29 y/o F ordered ampicillin, clindamycin, and gentamicin for chorioamnionitis.   Goal of Therapy:  Gentamicin peak level: 4-6 mcg/ml Gentamicin trough level <2 mcg/ml  Plan:  Gentamicin 180 mg iv loading dose then 90 mg iv q 8 hours. Will check a peak and trough with the third dose.   Luisa Hart D 09/25/2014,2:15 PM

## 2014-09-25 NOTE — Progress Notes (Signed)
OB Note RN just rechecked and temp 101.4. apap 1gm, amp and gent.   [name] MD [facility]  Pager: [phone]

## 2014-09-25 NOTE — Op Note (Addendum)
Operative Note   SURGERY DATE: 09/25/2014  PRE-OP DIAGNOSIS:  *Intrauterine pregnancy @ 40/1 weeks *Chorioaminonitis *Failed induction of labor *Arrest of dilation at 3-4cm *Rh negative *BMI 44  POST-OP DIAGNOSIS: Same. Cephalopelvic disproportion   PROCEDURE: primary low transverse cesarean section via pfannenstiel skin incision with double layer uterine closure  SURGEON: Surgeon(s) and Role:    * Lanark Bing, MD - Primary  ASSISTANT: None  ANESTHESIA: epidural  ESTIMATED BLOOD LOSS:  DRAINS: via indwelling foley   TOTAL IV FLUIDS:  VTE Prophylaxis: SCDs pre op  Antibiotics: ampicillin, given within 1 hour of skin incision and clindamycin  intra operatively  SPECIMENS: placenta to path  COMPLICATIONS: none  INDICATIONS: arrest of dilation at 3-4cm and diagnosis of chorio with persistent category II tracing of fetal tachycardia, minimal variability.   FINDINGS: No intra-abdominal adhesions were noted. Grossly normal uterus, tubes and ovaries. clear amniotic fluid, cephalic female infant, weight 3320gm, APGARs 8/9, intact placenta with 3 vessel cord. Pelvis felt tight from above. Moderate caput present.  PROCEDURE IN DETAIL: The patient was taken to the operating room where anesthesia was administered and normal fetal heart tones were confirmed. She was then prepped and draped in the normal fashion in the dorsal supine position with a leftward tilt.  After a time out was performed, a pfannensteil skin incision was made with the scalpel and carried through to the underlying layer of fascia. The fascia was then incised at the midline and this incision was extended laterally with the mayo scissors. Attention was turned to the superior aspect of the fascial incision which was grasped with the kocher clamps x 2, tented up and the rectus muscles were dissected off with the bovie. In a similar fashion the inferior aspect of the fascial incision was grasped  with the kocher clamps, tented up and the rectus muscles dissected off with the mayo scissors. The rectus muscles were then separated in the midline and the peritoneum was entered bluntly. The bladder blade was inserted and the vesicouterine peritoneum was identified, tented up and entered with the metzenbaum scissors. This incision was extended laterally and the bladder flap was created digitally. The bladder blade was reinserted.  A low transverse hysterotomy was made with the scalpel until the endometrial cavity was breached and the amniotic sac ruptured with the Allis clamp, yielding clear amniotic fluid. This incision was extended bluntly and the infant's head, shoulders and body were delivered atraumatically.The cord was clamped x 2 and cut, and the infant was handed to the awaiting pediatricians.  The placenta was then gradually expressed from the uterus and then the uterus was exteriorized and cleared of all clots and debris. The hysterotomy was repaired with a running suture of 1-0 monocryl. A second imbricating layer of 1-0 monocryl suture was then placed. One figure-of-eight suture of 1-0 monocryl were added to achieve excellent hemostasis.   The uterus and adnexa were then returned to the abdomen, and the hysterotomy and all operative sites were reinspected and excellent hemostasis was noted after irrigation and suction of the abdomen with warm saline.  The fascia was reapproximated with 0 vicryl in a simple running fashion. The subcutaneous layer was then reapproximated with interrupted sutures of 2-0 plain gut, and the skin was then closed with 4-0 monocryl, in a subcuticular fashion.  The patient  tolerated the procedure well. Sponge, lap, needle, and instrument counts were correct x 2. The patient was transferred to the recovery room awake, alert and breathing independently in  stable condition.  I will advise the patient at her postoperative appointment to not TOLAC in the future, given  that her pelvis felt narrow from above and the amount of caput on the infant.   She will receive her gentamycin in the PACU and receive triple antibiotics for 24hrs post op.  [name] MD [facility] Pager [phone]

## 2014-09-26 ENCOUNTER — Encounter: Payer: Self-pay | Admitting: Obstetrics and Gynecology

## 2014-09-26 LAB — FETAL SCREEN: FETAL SCREEN: NEGATIVE

## 2014-09-26 LAB — CBC
HCT: 26 % — ABNORMAL LOW (ref 35.0–47.0)
HEMOGLOBIN: 8.3 g/dL — AB (ref 12.0–16.0)
MCH: 24.9 pg — AB (ref 26.0–34.0)
MCHC: 31.8 g/dL — ABNORMAL LOW (ref 32.0–36.0)
MCV: 78.2 fL — ABNORMAL LOW (ref 80.0–100.0)
PLATELETS: 121 10*3/uL — AB (ref 150–440)
RBC: 3.32 MIL/uL — AB (ref 3.80–5.20)
RDW: 18.7 % — ABNORMAL HIGH (ref 11.5–14.5)
WBC: 16.8 10*3/uL — AB (ref 3.6–11.0)

## 2014-09-26 MED ORDER — RHO D IMMUNE GLOBULIN 1500 UNIT/2ML IJ SOSY
300.0000 ug | PREFILLED_SYRINGE | Freq: Once | INTRAMUSCULAR | Status: AC
  Administered 2014-09-26: 300 ug via INTRAMUSCULAR
  Filled 2014-09-26: qty 2

## 2014-09-26 NOTE — Anesthesia Postprocedure Evaluation (Signed)
  Anesthesia Post-op Note  Patient: Tammy Chase  Procedure(s) Performed: Procedure(s): CESAREAN SECTION (N/A)  Anesthesia type:No value filed.  Patient location: PACU  Post pain: Pain level controlled  Post assessment: Post-op Vital signs reviewed, Patient's Cardiovascular Status Stable, Respiratory Function Stable, Patent Airway and No signs of Nausea or vomiting  Post vital signs: Reviewed and stable  Last Vitals:  Filed Vitals:   09/26/14 0255  BP: 105/50  Pulse: 85  Temp: 36.9 C  Resp: 18    Level of consciousness: awake, alert  and patient cooperative  Complications: No apparent anesthesia complications

## 2014-09-26 NOTE — Anesthesia Preprocedure Evaluation (Signed)
Anesthesia Evaluation  Patient identified by MRN, date of birth, ID band Patient awake    Reviewed: Allergy & Precautions, NPO status , Patient's Chart, lab work & pertinent test results, reviewed documented beta blocker date and time   Airway Mallampati: II  TM Distance: >3 FB     Dental  (+) Chipped   Pulmonary           Cardiovascular      Neuro/Psych    GI/Hepatic   Endo/Other    Renal/GU      Musculoskeletal   Abdominal   Peds  Hematology   Anesthesia Other Findings   Reproductive/Obstetrics                             Anesthesia Physical Anesthesia Plan  ASA: II  Anesthesia Plan: Epidural   Post-op Pain Management:    Induction:   Airway Management Planned:   Additional Equipment:   Intra-op Plan:   Post-operative Plan:   Informed Consent: I have reviewed the patients History and Physical, chart, labs and discussed the procedure including the risks, benefits and alternatives for the proposed anesthesia with the patient or authorized representative who has indicated his/her understanding and acceptance.     Plan Discussed with: CRNA  Anesthesia Plan Comments:         Anesthesia Quick Evaluation  

## 2014-09-26 NOTE — Progress Notes (Signed)
Subjective:  Doing well.  Does not report adequate pain control but has only taken on percocet so far.  Minimal lochia.     Objective:   Filed Vitals:   09/25/14 1917 09/25/14 2346 09/26/14 0255 09/26/14 0830  BP: 106/56 108/52 105/50 94/53  Pulse: 85 92 85 82  Temp: 98.6 F (37 C) 99 F (37.2 C) 98.5 F (36.9 C) 98.7 F (37.1 C)  TempSrc: Oral Oral Oral Oral  Resp: 18 18 18 18   Height:      Weight:      SpO2: 99% 97% 98% 96%    Intake/Output Summary (Last 24 hours) at 09/26/14 0949 Last data filed at 09/26/14 0515  Gross per 24 hour  Intake 737.67 ml  Output   1975 ml  Net -1237.33 ml     General: NAD Pulmonary: no increased work of breathing Abdomen: non-distended, non-tender, fundus firm at level of umbilicus Incision: Dressing D/C/I Extremities: no edema, no erythema, no tenderness  Results for orders placed or performed during the hospital encounter of 09/23/14 (from the past 72 hour(s))  CBC     Status: Abnormal   Collection Time: 09/23/14  9:20 PM  Result Value Ref Range   WBC 9.0 3.6 - 11.0 K/uL   RBC 3.90 3.80 - 5.20 MIL/uL   Hemoglobin 9.7 (L) 12.0 - 16.0 g/dL   HCT 30.7 (L) 35.0 - 47.0 %   MCV 78.8 (L) 80.0 - 100.0 fL   MCH 25.0 (L) 26.0 - 34.0 pg   MCHC 31.7 (L) 32.0 - 36.0 g/dL   RDW 18.4 (H) 11.5 - 14.5 %   Platelets 142 (L) 150 - 440 K/uL  RPR     Status: None   Collection Time: 09/23/14  9:20 PM  Result Value Ref Range   RPR Ser Ql Non Reactive Non Reactive    Comment: (NOTE) Performed At: [facility] [place], [place] [id number] Lindon Romp MD ZJ:[id number]   Type and screen     Status: None   Collection Time: 09/23/14  9:21 PM  Result Value Ref Range   ABO/RH(D) A NEG    Antibody Screen NEG    Sample Expiration 09/26/2014   ABO/Rh     Status: None   Collection Time: 09/23/14  9:22 PM  Result Value Ref Range   ABO/RH(D) A NEG   Platelet count     Status: Abnormal   Collection Time: 09/25/14  8:21 AM   Result Value Ref Range   Platelets 137 (L) 150 - 440 K/uL  Creatinine, serum     Status: None   Collection Time: 09/25/14  8:21 AM  Result Value Ref Range   Creatinine, Ser 0.79 0.44 - 1.00 mg/dL   GFR calc non Af Amer >60 >60 mL/min   GFR calc Af Amer >60 >60 mL/min    Comment: (NOTE) The eGFR has been calculated using the CKD EPI equation. This calculation has not been validated in all clinical situations. eGFR's persistently <60 mL/min signify possible Chronic Kidney Disease.   CBC     Status: Abnormal   Collection Time: 09/26/14  6:18 AM  Result Value Ref Range   WBC 16.8 (H) 3.6 - 11.0 K/uL   RBC 3.32 (L) 3.80 - 5.20 MIL/uL   Hemoglobin 8.3 (L) 12.0 - 16.0 g/dL   HCT 26.0 (L) 35.0 - 47.0 %   MCV 78.2 (L) 80.0 - 100.0 fL   MCH 24.9 (L) 26.0 - 34.0 pg  MCHC 31.8 (L) 32.0 - 36.0 g/dL   RDW 18.7 (H) 11.5 - 14.5 %   Platelets 121 (L) 150 - 440 K/uL     Assessment:   29 y.o. G1P1001 postoperativeday #1 1LTCS for NRFS, failed IOL   Plan:  1) Acute blood loss anemia - hemodynamically stable and asymptomatic - po ferrous sulfate - cntinue postoperative care  2) A neg / RI / VZI Information for the patient's newborn:  Caryl, Fate [[id number]]  A POS  - needs rhogam

## 2014-09-26 NOTE — Anesthesia Post-op Follow-up Note (Signed)
  Anesthesia Pain Follow-up Note  Patient: Tammy Chase  Day #: 1  Date of Follow-up: 09/26/2014 Time: 7:36 AM  Last Vitals:  Filed Vitals:   09/26/14 0255  BP: 105/50  Pulse: 85  Temp: 36.9 C  Resp: 18    Level of Consciousness: alert  Pain: none   Side Effects:None  Catheter Site Exam:clean, dry, no drainage  Plan: D/C from anesthesia care  Basilio Cairo

## 2014-09-27 LAB — RHOGAM INJECTION: Unit division: 0

## 2014-09-27 NOTE — Progress Notes (Signed)
  Subjective:  Doing well still minimal ambulation yesterday.  Doing better with pain.  Minimal lochia.   Objective:  Blood pressure 103/63, pulse 66, temperature 98 F (36.7 C), temperature source Oral, resp. rate 18, height _0  (1.549 m), weight 102.513 kg (226 lb), SpO2 95 %, unknown if currently breastfeeding.  General: NAD Pulmonary: no increased work of breathing Abdomen: non-distended, non-tender, fundus firm at level of umbilicus Incision: D/C/I suture line Extremities: no edema, no erythema, no tenderness  Results for orders placed or performed during the hospital encounter of 09/23/14 (from the past 72 hour(s))  Platelet count     Status: Abnormal   Collection Time: 09/25/14  8:21 AM  Result Value Ref Range   Platelets 137 (L) 150 - 440 K/uL  Creatinine, serum     Status: None   Collection Time: 09/25/14  8:21 AM  Result Value Ref Range   Creatinine, Ser 0.79 0.44 - 1.00 mg/dL   GFR calc non Af Amer >60 >60 mL/min   GFR calc Af Amer >60 >60 mL/min    Comment: (NOTE) The eGFR has been calculated using the CKD EPI equation. This calculation has not been validated in all clinical situations. eGFR's persistently <60 mL/min signify possible Chronic Kidney Disease.   Fetal screen     Status: None   Collection Time: 09/26/14  6:18 AM  Result Value Ref Range   Fetal Screen NEG   Rhogam injection     Status: None (Preliminary result)   Collection Time: 09/26/14  6:18 AM  Result Value Ref Range   Unit Number [id number]    Blood Component Type RHIG    Unit division 00    Status of Unit ISSUED    Transfusion Status OK TO TRANSFUSE   CBC     Status: Abnormal   Collection Time: 09/26/14  6:18 AM  Result Value Ref Range   WBC 16.8 (H) 3.6 - 11.0 K/uL   RBC 3.32 (L) 3.80 - 5.20 MIL/uL   Hemoglobin 8.3 (L) 12.0 - 16.0 g/dL   HCT 26.0 (L) 35.0 - 47.0 %   MCV 78.2 (L) 80.0 - 100.0 fL   MCH 24.9 (L) 26.0 - 34.0 pg   MCHC 31.8 (L) 32.0 - 36.0 g/dL   RDW 18.7 (H) 11.5 -  14.5 %   Platelets 121 (L) 150 - 440 K/uL     Assessment:   29 y.o. G1P1001 postoperativeday # 2 1LTCS for NRFS, chorio s/p antibiotics  Plan:  1) Acute blood loss anemia - hemodynamically stable and asymptomatic - po ferrous sulfate  2) --/--/A NEG (08/17 2122)  Information for the patient's newborn:  Itza, [place] Girl Tomasina [[id number]]  A POS - verify rhogam prior to dischage  3) Disposition - anticipate d/c POD3-4

## 2014-09-28 NOTE — Lactation Note (Signed)
This note was copied from the chart of Tammy Keni Wafer. Lactation Consultation Note  Patient Name: Tammy Chase RUEAV'W Date: 09/28/2014 Reason for consult: Difficult latch;Hyperbilirubinemia   Maternal Data  Mom shown how to use SNS to supplement breast milk, difficult to coordinate latch with using sns and nipple shield at first but did well with practice, mom encouraged to pump after every feed to increase milk production, beginning today to produce more in right breast with pumping, obtained 4 cc with pumping at 11:30 am.  THis was given by syringe to baby while nursing with nipple shield  Feeding Feeding Type: Breast Milk with Formula added Length of feed: 30 min  LATCH Score/Interventions Latch: Repeated attempts needed to sustain latch, nipple held in mouth throughout feeding, stimulation needed to elicit sucking reflex. Intervention(s):  (using nipple shield, fussy at breast, until calms) Intervention(s): Adjust position;Assist with latch  Audible Swallowing: Spontaneous and intermittent  Type of Nipple: Flat Intervention(s): Double electric pump (nipple shield)  Comfort (Breast/Nipple): Filling, red/small blisters or bruises, mild/mod discomfort  Problem noted: Mild/Moderate discomfort Interventions (Mild/moderate discomfort): Comfort gels  Hold (Positioning): Assistance needed to correctly position infant at breast and maintain latch.  LATCH Score: 6  Lactation Tools Discussed/Used Tools: Nipple Dorris Carnes;Supplemental Nutrition System Nipple shield size: 20 WIC Program: Yes   Consult Status      Tammy Chase 09/28/2014, 3:11 PM

## 2014-09-28 NOTE — Progress Notes (Signed)
  Subjective:  Doing well, except more swelling. Passing gas and had a BM. Baby under bililights. Workign with LC-had sore nipples and not pumping very much milk yet. Has been syringe feeding and tried S&S.   Objective:  Blood pressure 115/64, pulse 69, temperature 98 F (36.7 C), temperature source Oral, resp. rate 16, height  (1.549 m), weight 226 lb (102.513 kg), SpO2 96 %, unknown if currently breastfeeding.  General: NAD, comfortable, tolerating a regular diet Heart: RRR without murmur Pulmonary: no increased work of breathing, CTA Abdomen: non-distended, non-tender, obese Incision: steristrips intact, C=D. No erythema Extremities:  Edema of LE and feet, no erythema, no tenderness  Results for orders placed or performed during the hospital encounter of 09/23/14 (from the past 72 hour(s))  Fetal screen     Status: None   Collection Time: 09/26/14  6:18 AM  Result Value Ref Range   Fetal Screen NEG   Rhogam injection     Status: None   Collection Time: 09/26/14  6:18 AM  Result Value Ref Range   Unit Number [id number]    Blood Component Type RHIG    Unit division 00    Status of Unit ISSUED,FINAL    Transfusion Status OK TO TRANSFUSE   CBC     Status: Abnormal   Collection Time: 09/26/14  6:18 AM  Result Value Ref Range   WBC 16.8 (H) 3.6 - 11.0 K/uL   RBC 3.32 (L) 3.80 - 5.20 MIL/uL   Hemoglobin 8.3 (L) 12.0 - 16.0 g/dL   HCT 11.9 (L) 14.7 - 82.9 %   MCV 78.2 (L) 80.0 - 100.0 fL   MCH 24.9 (L) 26.0 - 34.0 pg   MCHC 31.8 (L) 32.0 - 36.0 g/dL   RDW 56.2 (H) 13.0 - 86.5 %   Platelets 121 (L) 150 - 440 K/uL     Assessment:   29 y.o. G1P1001 postoperativeday #3-stable  Not ready for discharge  Plan:  1) Acute blood loss anemia - hemodynamically stable and asymptomatic - continue po ferrous sulfate and vitamins  2)A negative-received Rhogam on 8/20 RI / Varicella Immune  3) TDAP status -UTD  4) Breast and supplements  5) Disposition-probable discharge  tomorrow Farrel Conners, CNM

## 2014-09-28 NOTE — Anesthesia Postprocedure Evaluation (Signed)
  Anesthesia Post-op Note  Patient: Tammy Chase  Procedure(s) Performed: epidural  Anesthesia type:epidural  Patient location: PACU  Post pain: Pain level controlled  Post assessment: Post-op Vital signs reviewed, Patient's Cardiovascular Status Stable, Respiratory Function Stable, Patent Airway and No signs of Nausea or vomiting  Post vital signs: Reviewed and stable  Last Vitals:  Filed Vitals:   09/28/14 0716  BP: 107/48  Pulse: 76  Temp: 36.6 C  Resp: 14    Level of consciousness: awake, alert  and patient cooperative  Complications: No apparent anesthesia complications

## 2014-09-29 LAB — SURGICAL PATHOLOGY

## 2014-09-29 MED ORDER — FERROUS SULFATE 325 (65 FE) MG PO TABS: 325.0000 mg | ORAL_TABLET | Freq: Two times a day (BID) | ORAL | Status: DC

## 2014-09-29 MED ORDER — POLYETHYLENE GLYCOL 3350 17 G PO PACK: 17.0000 g | PACK | Freq: Every day | ORAL | Status: DC

## 2014-09-29 MED ORDER — IBUPROFEN 600 MG PO TABS: 600.0000 mg | ORAL_TABLET | Freq: Four times a day (QID) | ORAL | Status: DC | PRN

## 2014-09-29 MED ORDER — OXYCODONE-ACETAMINOPHEN 5-325 MG PO TABS: 1.0000 | ORAL_TABLET | Freq: Four times a day (QID) | ORAL | Status: DC | PRN

## 2014-09-29 NOTE — Progress Notes (Signed)
Patient understands all discharge instructions and the need to make follow up appointments. Patient discharge via wheelchair with auxillary. 

## 2014-09-29 NOTE — Progress Notes (Signed)
Daily Post Partum Note  Tammy Chase is a 29 y.o. G1P1001  POD#4 s/p pLTCS @ [redacted]w[redacted]d .  Pregnancy c/b chorio, BMI 44, h/o UTIs, Rh negative  24hr/overnight events:  none  Subjective:  Meeting all PO goals including BM  Objective:    Current Vital Signs 24h Vital Sign Ranges  T 98 F (36.7 C) Temp  Avg: 98.1 F (36.7 C)  Min: 98 F (36.7 C)  Max: 98.2 F (36.8 C)  BP (!) 102/45 mmHg BP  Min: 102/45  Max: 115/64  HR 77 Pulse  Avg: 72.8  Min: 69  Max: 77  RR 18 Resp  Avg: 17.5  Min: 16  Max: 18  SaO2 97 % Not Delivered SpO2  Avg: 98.3 %  Min: 97 %  Max: 99 %       24 Hour I/O Current Shift I/O  Time Ins Outs        General: NAD Abdomen: +BS, obese, soft, ND. C/d/i with steri strips in place Perineum: deferred Skin:  Warm and dry.  Cardiovascular:Regular rate and rhythm. Respiratory:  Clear to auscultation bilateral. Normal respiratory effort Extremities: no c/c/e  Medications Current Facility-Administered Medications  Medication Dose Route Frequency Provider Last Rate Last Dose  . acetaminophen (TYLENOL) tablet 650 mg  650 mg Oral Q4H PRN [place] Bing, MD      . witch hazel-glycerin (TUCKS) pad 1 application  1 application Topical PRN [place] Bing, MD       And  . dibucaine (NUPERCAINAL) 1 % rectal ointment 1 application  1 application Rectal PRN [place] Bing, MD      . diphenhydrAMINE (BENADRYL) capsule 25 mg  25 mg Oral Q6H PRN [place] Bing, MD      . ferrous sulfate tablet 325 mg  325 mg Oral Q breakfast [place] Bing, MD   325 mg at 09/29/14 0724  . ibuprofen (ADVIL,MOTRIN) tablet 600 mg  600 mg Oral 4 times per day [place] Bing, MD   600 mg at 09/29/14 0518  . lanolin ointment 1 application  1 application Topical PRN [place] Bing, MD      . magnesium hydroxide (MILK OF MAGNESIA) suspension 30 mL  30 mL Oral Q3 days PRN [place] Bing, MD      . menthol-cetylpyridinium (CEPACOL) lozenge 3 mg  1 lozenge Oral Q2H PRN [place] Bing, MD      .  oxyCODONE-acetaminophen (PERCOCET/ROXICET) 5-325 MG per tablet 1 tablet  1 tablet Oral Q6H PRN [place] Bing, MD   1 tablet at 09/29/14 0518  . oxyCODONE-acetaminophen (PERCOCET/ROXICET) 5-325 MG per tablet 2 tablet  2 tablet Oral Q6H PRN [place] Bing, MD   2 tablet at 09/28/14 1302  . polyethylene glycol (MIRALAX / GLYCOLAX) packet 17 g  17 g Oral Daily [place] Bing, MD   17 g at 09/28/14 2332  . prenatal multivitamin tablet 1 tablet  1 tablet Oral Q1200 [place] Bing, MD   1 tablet at 09/28/14 1728  . simethicone (MYLICON) chewable tablet 80 mg  80 mg Oral BID WC [place] Bing, MD   80 mg at 09/29/14 0724  . Tdap (BOOSTRIX) injection 0.5 mL  0.5 mL Intramuscular Once [place] Bing, MD         Recent Labs Lab 09/23/14 2120 09/25/14 0821 09/26/14 0618  WBC 9.0  --  16.8*  HGB 9.7*  --  8.3*  HCT 30.7*  --  26.0*  PLT 142* 137* 121*     Assessment & Plan:  Pt doing well *  Postpartum/postop: routine care. Iron for asymptomatic anemia *Rh neg: s/p rhogam already *Dispo: later today  A NEG / Rubella Immune / Varicella Immune/  RPR negative / HIV negative / HepBsAg negative / Tdap UTD: Yes/pap neg 2015 / Breast  / Contraception: pt unsure / Follow up: Johnsie Kindred. MD [facility] Pager [phone]

## 2014-09-29 NOTE — Lactation Note (Signed)
This note was copied from the chart of Tammy Chase. Lactation Consultation Note  Patient Name: Tammy Chase Today's Date: 09/29/2014     Maternal Data  Mom has not pumped in over 14 hours. She is being discharged with baby today. She and LCs (including me this morning) have tried to contact WIC so she can get pump from them. No answer yet. I reviewed pump options from [facility]. Mom chose to leave here with her pump kit and go to WIC in person to get electric pump. She plans to come back to hospital to rent if needed. I also instructed her on how to use double pump with manual piece. She is mostly bottle feeding formula and declined LC help with breastfeeding today.  Feeding Feeding Type: Bottle Fed - Formula Nipple Type: Slow - flow  LATCH Score/Interventions                      Lactation Tools Discussed/Used     Consult Status       [place]  09/29/2014, 11:58 AM    

## 2014-10-14 NOTE — Addendum Note (Signed)
Addendum  created 10/14/14 4098 by Berdine Addison, MD   Modules edited: Notes Section   Notes Section:  Delete: [id number]

## 2016-11-21 DIAGNOSIS — N926 Irregular menstruation, unspecified: Secondary | ICD-10-CM | POA: Insufficient documentation

## 2016-11-21 DIAGNOSIS — Z0000 Encounter for general adult medical examination without abnormal findings: Secondary | ICD-10-CM | POA: Diagnosis not present

## 2016-11-21 DIAGNOSIS — Z23 Encounter for immunization: Secondary | ICD-10-CM | POA: Diagnosis not present

## 2016-11-21 DIAGNOSIS — D649 Anemia, unspecified: Secondary | ICD-10-CM | POA: Diagnosis not present

## 2017-02-21 DIAGNOSIS — E282 Polycystic ovarian syndrome: Secondary | ICD-10-CM | POA: Diagnosis not present

## 2017-03-07 DIAGNOSIS — E282 Polycystic ovarian syndrome: Secondary | ICD-10-CM | POA: Diagnosis not present

## 2017-03-16 DIAGNOSIS — N970 Female infertility associated with anovulation: Secondary | ICD-10-CM | POA: Diagnosis not present

## 2017-03-25 DIAGNOSIS — N970 Female infertility associated with anovulation: Secondary | ICD-10-CM | POA: Diagnosis not present

## 2017-04-03 DIAGNOSIS — N970 Female infertility associated with anovulation: Secondary | ICD-10-CM | POA: Diagnosis not present

## 2017-04-05 DIAGNOSIS — N970 Female infertility associated with anovulation: Secondary | ICD-10-CM | POA: Diagnosis not present

## 2017-11-28 DIAGNOSIS — N970 Female infertility associated with anovulation: Secondary | ICD-10-CM | POA: Diagnosis not present

## 2018-04-15 DIAGNOSIS — N970 Female infertility associated with anovulation: Secondary | ICD-10-CM | POA: Diagnosis not present

## 2018-07-10 DIAGNOSIS — N970 Female infertility associated with anovulation: Secondary | ICD-10-CM | POA: Diagnosis not present

## 2018-07-18 DIAGNOSIS — Z20828 Contact with and (suspected) exposure to other viral communicable diseases: Secondary | ICD-10-CM | POA: Diagnosis not present

## 2018-08-01 ENCOUNTER — Other Ambulatory Visit: Payer: Self-pay

## 2018-08-01 ENCOUNTER — Encounter: Payer: Self-pay | Admitting: Family Medicine

## 2018-08-01 ENCOUNTER — Ambulatory Visit: Payer: BC Managed Care – PPO | Admitting: Family Medicine

## 2018-08-01 VITALS — BP 120/62 | HR 80 | Ht 61.0 in | Wt 230.0 lb

## 2018-08-01 DIAGNOSIS — Z7689 Persons encountering health services in other specified circumstances: Secondary | ICD-10-CM | POA: Diagnosis not present

## 2018-08-01 NOTE — Progress Notes (Signed)
Date:  08/01/2018   Name:  Tammy Chase   DOB:  08/15/1985   MRN:  [id number]   Chief Complaint: Establish Care (hasn't had a PCP- need for acute issues)  Patient is a 33 year old female who presents for a comprehensive physical exam. The patient reports the following problems: none. Health maintenance has been reviewed up to date.   Review of Systems  Constitutional: Negative.  Negative for chills, fatigue, fever and unexpected weight change.  HENT: Negative for congestion, ear discharge, ear pain, rhinorrhea, sinus pressure, sneezing and sore throat.   Eyes: Negative for photophobia, pain, discharge, redness and itching.  Respiratory: Negative for cough, shortness of breath, wheezing and stridor.   Gastrointestinal: Negative for abdominal pain, blood in stool, constipation, diarrhea, nausea and vomiting.  Endocrine: Negative for cold intolerance, heat intolerance, polydipsia, polyphagia and polyuria.  Genitourinary: Negative for dysuria, flank pain, frequency, hematuria, menstrual problem, pelvic pain, urgency, vaginal bleeding and vaginal discharge.  Musculoskeletal: Negative for arthralgias, back pain and myalgias.  Skin: Negative for rash.  Allergic/Immunologic: Negative for environmental allergies and food allergies.  Neurological: Negative for dizziness, weakness, light-headedness, numbness and headaches.  Hematological: Negative for adenopathy. Does not bruise/bleed easily.  Psychiatric/Behavioral: Negative for dysphoric mood. The patient is not nervous/anxious.     Patient Active Problem List   Diagnosis Date Noted  . Infertility, anovulation 03/16/2017  . Irregular menstrual bleeding 11/21/2016  . Pregnancy 09/23/2014  . Obesity affecting pregnancy in third trimester, antepartum 09/23/2014  . Indication for care in labor or delivery 09/23/2014    No Known Allergies  Past Surgical History:  Procedure Laterality Date  . CESAREAN SECTION N/A 09/25/2014   Procedure:  CESAREAN SECTION;  Surgeon: Cearfoss Bingharlie Pickens, MD;  Location: [facility] ORS;  Service: Obstetrics;  Laterality: N/A;    Social History   Tobacco Use  . Smoking status: Never Smoker  . Smokeless tobacco: Never Used  Substance Use Topics  . Alcohol use: Yes    Comment: occasional  . Drug use: No     Medication list has been reviewed and updated.  Current Meds  Medication Sig  . ibuprofen (ADVIL,MOTRIN) 600 MG tablet Take 1 tablet (600 mg total) by mouth every 6 (six) hours as needed for mild pain or cramping.  Marland Kitchen. letrozole (FEMARA) 2.5 MG tablet Take by mouth. Dr Madaline GuthrieSteiner  . medroxyPROGESTERone (PROVERA) 10 MG tablet Take by mouth. Dr Madaline GuthrieSteiner  . Prenatal Vit-Fe Fumarate-FA (MULTIVITAMIN-PRENATAL) 27-0.8 MG TABS tablet Take 1 tablet by mouth daily at 12 noon.    PHQ 2/9 Scores 08/01/2018  PHQ - 2 Score 0  PHQ- 9 Score 2    BP Readings from Last 3 Encounters:  08/01/18 120/62  09/29/14 112/67    Physical Exam Vitals signs and nursing note reviewed. Exam conducted with a chaperone present.  Constitutional:      Appearance: She is well-developed.  HENT:     Head: Normocephalic.     Right Ear: Tympanic membrane, ear canal and external ear normal.     Left Ear: Tympanic membrane, ear canal and external ear normal.  Eyes:     General: Lids are everted, no foreign bodies appreciated. No scleral icterus.       Left eye: No foreign body or hordeolum.     Conjunctiva/sclera: Conjunctivae normal.     Right eye: Right conjunctiva is not injected.     Left eye: Left conjunctiva is not injected.     Pupils: Pupils are equal, round,  and reactive to light.  Neck:     Musculoskeletal: Normal range of motion and neck supple.     Thyroid: No thyromegaly.     Vascular: No JVD.     Trachea: No tracheal deviation.  Cardiovascular:     Rate and Rhythm: Normal rate and regular rhythm.     Heart sounds: Normal heart sounds. No murmur. No friction rub. No gallop.   Pulmonary:     Effort:  Pulmonary effort is normal. No respiratory distress.     Breath sounds: Normal breath sounds. No wheezing or rales.  Abdominal:     General: Bowel sounds are normal.     Palpations: Abdomen is soft. There is no mass.     Tenderness: There is no abdominal tenderness. There is no guarding or rebound.  Musculoskeletal: Normal range of motion.        General: No tenderness.  Lymphadenopathy:     Cervical: No cervical adenopathy.  Skin:    General: Skin is warm.     Findings: No rash.  Neurological:     Mental Status: She is alert and oriented to person, place, and time.     Cranial Nerves: No cranial nerve deficit.     Deep Tendon Reflexes: Reflexes normal.  Psychiatric:        Mood and Affect: Mood is not anxious or depressed.     Wt Readings from Last 3 Encounters:  08/01/18 230 lb (104.3 kg)  09/23/14 226 lb (102.5 kg)    BP 120/62   Pulse 80   Ht 5\' 1"  (1.549 m)   Wt 230 lb (104.3 kg)   LMP 08/01/2018 (Exact Date)   Breastfeeding No   BMI 43.46 kg/m   Assessment and Plan: 1. Establishing care with new doctor, encounter for Patient establishes care with new physician. Patient is to discuss PCOS with tilted physician for referral to GYN.  I have been unable to evaluate previous ultrasound to determine diagnosis.  In the meantime we had a discussion about unwanted hair removal by electrolysis.

## 2018-08-01 NOTE — Patient Instructions (Signed)
Polycystic Ovarian Syndrome    Polycystic ovarian syndrome (PCOS) is a common hormonal disorder among women of reproductive age. In most women with PCOS, many small fluid-filled sacs (cysts) grow on the ovaries, and the cysts are not part of a normal menstrual cycle. PCOS can cause problems with your menstrual periods and make it difficult to get pregnant. It can also cause an increased risk of miscarriage with pregnancy. If it is not treated, PCOS can lead to serious health problems, such as diabetes and heart disease.  What are the causes?  The cause of PCOS is not known, but it may be the result of a combination of certain factors, such as:  · Irregular menstrual cycle.  · High levels of certain hormones (androgens).  · Problems with the hormone that helps to control blood sugar (insulin resistance).  · Certain genes.  What increases the risk?  This condition is more likely to develop in women who have a family history of PCOS.  What are the signs or symptoms?  Symptoms of PCOS may include:  · Multiple ovarian cysts.  · Infrequent periods or no periods.  · Periods that are too frequent or too heavy.  · Unpredictable periods.  · Inability to get pregnant (infertility) because of not ovulating.  · Increased growth of hair on the face, chest, stomach, back, thumbs, thighs, or toes.  · Acne or oily skin. Acne may develop during adulthood, and it may not respond to treatment.  · Pelvic pain.  · Weight gain or obesity.  · Patches of thickened and dark brown or black skin on the neck, arms, breasts, or thighs (acanthosis nigricans).  · Excess hair growth on the face, chest, abdomen, or upper thighs (hirsutism).  How is this diagnosed?  This condition is diagnosed based on:  · Your medical history.  · A physical exam, including a pelvic exam. Your health care provider may look for areas of increased hair growth on your skin.  · Tests, such as:  ? Ultrasound. This may be used to examine the ovaries and the lining of the  uterus (endometrium) for cysts.  ? Blood tests. These may be used to check levels of sugar (glucose), female hormone (testosterone), and female hormones (estrogen and progesterone) in your blood.  How is this treated?  There is no cure for PCOS, but treatment can help to manage symptoms and prevent more health problems from developing. Treatment varies depending on:  · Your symptoms.  · Whether you want to have a baby or whether you need birth control (contraception).  Treatment may include nutrition and lifestyle changes along with:  · Progesterone hormone to start a menstrual period.  · Birth control pills to help you have regular menstrual periods.  · Medicines to make you ovulate, if you want to get pregnant.  · Medicine to reduce excessive hair growth.  · Surgery, in severe cases. This may involve making small holes in one or both of your ovaries. This decreases the amount of testosterone that your body produces.  Follow these instructions at home:  · Take over-the-counter and prescription medicines only as told by your health care provider.  · Follow a healthy meal plan. This can help you reduce the effects of PCOS.  ? Eat a healthy diet that includes lean proteins, complex carbohydrates, fresh fruits and vegetables, low-fat dairy products, and healthy fats. Make sure to eat enough fiber.  · If you are overweight, lose weight as told by your health care   provider.  ? Losing 10% of your body weight may improve symptoms.  ? Your health care provider can determine how much weight loss is best for you and can help you lose weight safely.  · Keep all follow-up visits as told by your health care provider. This is important.  Contact a health care provider if:  · Your symptoms do not get better with medicine.  · You develop new symptoms.  This information is not intended to replace advice given to you by your health care provider. Make sure you discuss any questions you have with your health care provider.  Document  Released: 05/19/2004 Document Revised: 09/21/2015 Document Reviewed: 07/11/2015  Elsevier Interactive Patient Education © 2019 Elsevier Inc[employer]          Diet for Polycystic Ovary Syndrome  Polycystic ovary syndrome (PCOS) is a disorder of the chemicals (hormones) that regulate a woman's reproductive system, including monthly periods (menstruation). The condition causes important hormones to be out of balance. PCOS can:  · Stop your periods or make them irregular.  · Cause cysts to develop on your ovaries.  · Make it difficult to get pregnant.  · Stop your body from responding to the effects of insulin (insulin resistance). Insulin resistance can lead to obesity and diabetes.  Changing what you eat can help you manage PCOS and improve your health. Following a balanced diet can help you lose weight and improve the way that your body uses insulin.  What are tips for following this plan?  · Follow a balanced diet for meals and snacks. Eat breakfast, lunch, dinner, and one or two snacks every day.  · Include protein in each meal and snack.  · Choose whole grains instead of products that are made with refined flour.  · Eat a variety of foods.  · Exercise regularly as told by your health care provider. Aim to do 30 or more minutes of exercise on most days of the week.  · If you are overweight or obese:  ? Pay attention to how many calories you eat. Cutting down on calories can help you lose weight.  ? Work with your health care provider or a diet and nutrition specialist (dietitian) to figure out how many calories you need each day.  What foods can I eat?    Fruits  Include a variety of colors and types. All fruits are helpful for PCOS.  Vegetables  Include a variety of colors and types. All vegetables are helpful for PCOS.  Grains  Whole grains, such as whole wheat. Whole-grain breads, crackers, cereals, and pasta. Unsweetened oatmeal, bulgur, barley, quinoa, and brown rice. Tortillas made from corn or whole-wheat  flour.  Meats and other proteins  Low-fat (lean) proteins, such as fish, chicken, beans, eggs, and tofu.  Dairy  Low-fat dairy products, such as skim milk, cheese sticks, and yogurt.  Beverages  Low-fat or fat-free drinks, such as water, low-fat milk, sugar-free drinks, and small amounts of 100% fruit juice.  Seasonings and condiments  Ketchup. Mustard. Barbecue sauce. Relish. Low-fat or fat-free mayonnaise.  Fats and oils  Olive oil or canola oil. Walnuts and almonds.  The items listed above may not be a complete list of recommended foods and beverages. Contact a dietitian for more options.  What foods are not recommended?  Foods that are high in calories or fat. Fried foods. Sweets. Products that are made from refined white flour, including white bread, pastries, white rice, and pasta.  The items listed above   may not be a complete list of foods and beverages to avoid. Contact a dietitian for more information.  Summary  · PCOS is a hormonal imbalance that affects a woman's reproductive system.  · You can help to manage your PCOS by exercising regularly and eating a healthy, varied diet of vegetables, fruit, whole grains, low-fat (lean) protein, and low-fat dairy products.  · Changing what you eat can improve the way that your body uses insulin, help your hormones reach normal levels, and help you lose weight.  This information is not intended to replace advice given to you by your health care provider. Make sure you discuss any questions you have with your health care provider.  Document Released: 05/17/2015 Document Revised: 11/27/2016 Document Reviewed: 11/27/2016  Elsevier Interactive Patient Education © 2019 Elsevier Inc.

## 2018-08-19 DIAGNOSIS — N978 Female infertility of other origin: Secondary | ICD-10-CM | POA: Diagnosis not present

## 2018-08-19 DIAGNOSIS — Z3181 Encounter for male factor infertility in female patient: Secondary | ICD-10-CM | POA: Diagnosis not present

## 2018-09-11 DIAGNOSIS — Z3181 Encounter for male factor infertility in female patient: Secondary | ICD-10-CM | POA: Diagnosis not present

## 2018-09-11 DIAGNOSIS — N978 Female infertility of other origin: Secondary | ICD-10-CM | POA: Diagnosis not present

## 2018-09-16 DIAGNOSIS — Z1329 Encounter for screening for other suspected endocrine disorder: Secondary | ICD-10-CM | POA: Diagnosis not present

## 2018-09-16 DIAGNOSIS — Z113 Encounter for screening for infections with a predominantly sexual mode of transmission: Secondary | ICD-10-CM | POA: Diagnosis not present

## 2018-09-16 DIAGNOSIS — Z3141 Encounter for fertility testing: Secondary | ICD-10-CM | POA: Diagnosis not present

## 2018-09-16 DIAGNOSIS — Z0183 Encounter for blood typing: Secondary | ICD-10-CM | POA: Diagnosis not present

## 2018-09-16 DIAGNOSIS — Z0000 Encounter for general adult medical examination without abnormal findings: Secondary | ICD-10-CM | POA: Diagnosis not present

## 2018-10-07 DIAGNOSIS — N979 Female infertility, unspecified: Secondary | ICD-10-CM | POA: Diagnosis not present

## 2018-10-08 DIAGNOSIS — Z789 Other specified health status: Secondary | ICD-10-CM | POA: Diagnosis not present

## 2018-10-08 DIAGNOSIS — Z3141 Encounter for fertility testing: Secondary | ICD-10-CM | POA: Diagnosis not present

## 2018-10-16 DIAGNOSIS — N979 Female infertility, unspecified: Secondary | ICD-10-CM | POA: Diagnosis not present

## 2018-10-20 DIAGNOSIS — N979 Female infertility, unspecified: Secondary | ICD-10-CM | POA: Diagnosis not present

## 2018-10-22 DIAGNOSIS — N979 Female infertility, unspecified: Secondary | ICD-10-CM | POA: Diagnosis not present

## 2018-10-23 DIAGNOSIS — N979 Female infertility, unspecified: Secondary | ICD-10-CM | POA: Diagnosis not present

## 2018-10-24 DIAGNOSIS — Z01818 Encounter for other preprocedural examination: Secondary | ICD-10-CM | POA: Diagnosis not present

## 2018-10-24 DIAGNOSIS — N979 Female infertility, unspecified: Secondary | ICD-10-CM | POA: Diagnosis not present

## 2018-10-24 DIAGNOSIS — Z20828 Contact with and (suspected) exposure to other viral communicable diseases: Secondary | ICD-10-CM | POA: Diagnosis not present

## 2018-10-24 DIAGNOSIS — N978 Female infertility of other origin: Secondary | ICD-10-CM | POA: Diagnosis not present

## 2018-10-25 DIAGNOSIS — N979 Female infertility, unspecified: Secondary | ICD-10-CM | POA: Diagnosis not present

## 2018-10-26 DIAGNOSIS — N978 Female infertility of other origin: Secondary | ICD-10-CM | POA: Diagnosis not present

## 2018-10-28 DIAGNOSIS — R110 Nausea: Secondary | ICD-10-CM | POA: Diagnosis not present

## 2018-10-28 DIAGNOSIS — Z23 Encounter for immunization: Secondary | ICD-10-CM | POA: Diagnosis not present

## 2018-10-31 DIAGNOSIS — R110 Nausea: Secondary | ICD-10-CM | POA: Diagnosis not present

## 2018-10-31 DIAGNOSIS — N981 Hyperstimulation of ovaries: Secondary | ICD-10-CM | POA: Diagnosis not present

## 2018-11-12 DIAGNOSIS — N978 Female infertility of other origin: Secondary | ICD-10-CM | POA: Diagnosis not present

## 2018-11-14 DIAGNOSIS — N978 Female infertility of other origin: Secondary | ICD-10-CM | POA: Diagnosis not present

## 2018-11-26 DIAGNOSIS — N978 Female infertility of other origin: Secondary | ICD-10-CM | POA: Diagnosis not present

## 2018-11-29 DIAGNOSIS — N978 Female infertility of other origin: Secondary | ICD-10-CM | POA: Diagnosis not present

## 2018-12-01 DIAGNOSIS — N978 Female infertility of other origin: Secondary | ICD-10-CM | POA: Diagnosis not present

## 2018-12-09 DIAGNOSIS — Z20828 Contact with and (suspected) exposure to other viral communicable diseases: Secondary | ICD-10-CM | POA: Diagnosis not present

## 2018-12-25 DIAGNOSIS — N978 Female infertility of other origin: Secondary | ICD-10-CM | POA: Diagnosis not present

## 2018-12-27 DIAGNOSIS — N978 Female infertility of other origin: Secondary | ICD-10-CM | POA: Diagnosis not present

## 2019-01-07 DIAGNOSIS — N978 Female infertility of other origin: Secondary | ICD-10-CM | POA: Diagnosis not present

## 2019-01-13 DIAGNOSIS — N978 Female infertility of other origin: Secondary | ICD-10-CM | POA: Diagnosis not present

## 2019-01-22 DIAGNOSIS — N912 Amenorrhea, unspecified: Secondary | ICD-10-CM | POA: Diagnosis not present

## 2019-01-24 DIAGNOSIS — N912 Amenorrhea, unspecified: Secondary | ICD-10-CM | POA: Diagnosis not present

## 2019-02-14 DIAGNOSIS — N912 Amenorrhea, unspecified: Secondary | ICD-10-CM | POA: Diagnosis not present

## 2019-02-27 ENCOUNTER — Encounter: Payer: Self-pay | Admitting: Obstetrics and Gynecology

## 2019-02-27 ENCOUNTER — Other Ambulatory Visit (HOSPITAL_COMMUNITY)
Admission: RE | Admit: 2019-02-27 | Discharge: 2019-02-27 | Disposition: A | Discharge status: Home / Self Care | Payer: BC Managed Care – PPO | Source: Ambulatory Visit | Attending: Obstetrics and Gynecology | Admitting: Obstetrics and Gynecology

## 2019-02-27 ENCOUNTER — Other Ambulatory Visit: Payer: Self-pay

## 2019-02-27 ENCOUNTER — Ambulatory Visit (INDEPENDENT_AMBULATORY_CARE_PROVIDER_SITE_OTHER): Payer: BC Managed Care – PPO | Admitting: Obstetrics and Gynecology

## 2019-02-27 VITALS — BP 133/72 | HR 68 | Wt 227.0 lb

## 2019-02-27 DIAGNOSIS — Z3A09 9 weeks gestation of pregnancy: Secondary | ICD-10-CM | POA: Diagnosis not present

## 2019-02-27 DIAGNOSIS — O0990 Supervision of high risk pregnancy, unspecified, unspecified trimester: Secondary | ICD-10-CM | POA: Insufficient documentation

## 2019-02-27 DIAGNOSIS — O34219 Maternal care for unspecified type scar from previous cesarean delivery: Secondary | ICD-10-CM

## 2019-02-27 DIAGNOSIS — O09819 Supervision of pregnancy resulting from assisted reproductive technology, unspecified trimester: Secondary | ICD-10-CM | POA: Insufficient documentation

## 2019-02-27 DIAGNOSIS — O99211 Obesity complicating pregnancy, first trimester: Secondary | ICD-10-CM

## 2019-02-27 DIAGNOSIS — Z6841 Body Mass Index (BMI) 40.0 and over, adult: Secondary | ICD-10-CM

## 2019-02-27 DIAGNOSIS — O0991 Supervision of high risk pregnancy, unspecified, first trimester: Secondary | ICD-10-CM

## 2019-02-27 DIAGNOSIS — O99210 Obesity complicating pregnancy, unspecified trimester: Secondary | ICD-10-CM | POA: Insufficient documentation

## 2019-02-27 DIAGNOSIS — O09811 Supervision of pregnancy resulting from assisted reproductive technology, first trimester: Secondary | ICD-10-CM

## 2019-02-27 HISTORY — DX: Supervision of high risk pregnancy, unspecified, unspecified trimester: O09.90

## 2019-02-27 NOTE — Progress Notes (Signed)
NOB IVF, transfer was 01/13/2019   New Obstetric Patient H&P   Chief Complaint: "Desires prenatal care"   History of Present Illness: Patient is a 34 y.o. G2P1001 Not Hispanic or Latino female, Based on a day 5 embryo transfer and a 7 week ultrasound at [facility] her Estimated Date of Delivery: 10/01/19 and her EGA is [redacted]w[redacted]d.  Her last pap smear was a few years ago and was no abnormalities. The patient opted out of PGS.     Since her pregnancy started she claims she has experienced nausea and emesis and she takes Diclegis. She has also had some cramping. She denies vaginal bleeding. Her past medical history is noncontributory. Her prior pregnancies are notable for delivery by c-section due to failed induction of labor (3-4 cm), chorioamnionitis.   Since her LMP, she admits to the use of tobacco products  no She claims she has gained zero pounds since the start of her pregnancy.  There are cats in the home in the home  No  She admits close contact with children on a regular basis  yes  She has had chicken pox in the past yes She has had Tuberculosis exposures, symptoms, or previously tested positive for TB   no Current or past history of domestic violence. no  Genetic Screening/Teratology Counseling: (Includes patient, baby's father, or anyone in either family with:)   1. Patient's age >/= 54 [facility]  no 2. Thalassemia ([place], [place], Mediterranean, or Asian background): MCV<80  no 3. Neural tube defect (meningomyelocele, spina bifida, anencephaly)  no 4. Congenital heart defect  no  5. Down syndrome  no 6. Tay-Sachs (Jewish, [place]  no 7. Canavan's Disease  no 8. Sickle cell disease or trait (African)  no  9. Hemophilia or other blood disorders  no  10. Muscular dystrophy  no  11. Cystic fibrosis  no  12. Huntington's Chorea  no  13. Mental retardation/autism  no 14. Other inherited genetic or chromosomal disorder  no 15. Maternal metabolic disorder (DM, PKU, etc)  no 16.  Patient or FOB with a child with a birth defect not listed above no  16a. Patient or FOB with a birth defect themselves no 17. Recurrent pregnancy loss, or stillbirth  no  18. Any medications since LMP other than prenatal vitamins (include vitamins, supplements, OTC meds, drugs, alcohol)  Still taking estrogen patches and progesterone vaginal cream until 10 weeks per [facility]. She is also taking Diclegis.   19. Any other genetic/environmental exposure to discuss  no  Infection History:   1. Lives with someone with TB or TB exposed  no  2. Patient or partner has history of genital herpes  no 3. Rash or viral illness since LMP  no 4. History of STI (GC, CT, HPV, syphilis, HIV)  no 5. History of recent travel :  no  Other pertinent information:  no     Review of Systems:10 point review of systems negative unless otherwise noted in HPI  Past Medical History:  Diagnosis Date  . Obesity affecting pregnancy   . PCOS (polycystic ovarian syndrome)     Past Surgical History:  Procedure Laterality Date  . CESAREAN SECTION N/A 09/25/2014   Procedure: CESAREAN SECTION;  Surgeon: [place] Bing, MD;  Location: [facility] OR[facility];  Service: Obstetrics;  Laterality: N/A;    Gynecologic History: Patient's last menstrual period was 01/03/2019.  Obstetric History: G2P1001  Family History  Problem Relation Age of Onset  . Cancer Brother   .  Diabetes Maternal Grandmother   . Stroke Maternal Grandmother   . Cancer Maternal Grandfather     Social History   Socioeconomic History  . Marital status: Married    Spouse name: Not on file  . Number of children: Not on file  . Years of education: Not on file  . Highest education level: Not on file  Occupational History  . Not on file  Tobacco Use  . Smoking status: Never Smoker  . Smokeless tobacco: Never Used  Substance and Sexual Activity  . Alcohol use: Yes    Comment: occasional  . Drug use: No  . Sexual activity: Yes  Other Topics Concern   . Not on file  Social History Narrative  . Not on file   Social Determinants of Health   Financial Resource Strain:   . Difficulty of Paying Living Expenses: Not on file  Food Insecurity:   . Worried About Charity fundraiser in the Last Year: Not on file  . Ran Out of Food in the Last Year: Not on file  Transportation Needs:   . Lack of Transportation (Medical): Not on file  . Lack of Transportation (Non-Medical): Not on file  Physical Activity:   . Days of Exercise per Week: Not on file  . Minutes of Exercise per Session: Not on file  Stress:   . Feeling of Stress : Not on file  Social Connections:   . Frequency of Communication with Friends and Family: Not on file  . Frequency of Social Gatherings with Friends and Family: Not on file  . Attends Religious Services: Not on file  . Active Member of Clubs or Organizations: Not on file  . Attends Archivist Meetings: Not on file  . Marital Status: Not on file  Intimate Partner Violence:   . Fear of Current or Ex-Partner: Not on file  . Emotionally Abused: Not on file  . Physically Abused: Not on file  . Sexually Abused: Not on file    No Known Allergies  Prior to Admission medications   Medication Sig Start Date End Date Taking? Authorizing Provider  DICLEGIS 10-10 MG TBEC TWO TABS AT BEDTIME DAY 1&2. DAY 3, ADD 1 TAB IN AM IF NEEDED. ADD 1 TABLET MID AFTERNOON IF NEEDED 02/14/19  Yes [provider]  estradiol (VIVELLE-DOT) 0.1 MG/24HR patch Place onto the skin. 11/11/18 01/19/20 Yes [provider]  Prenatal Vit-Fe Fumarate-FA (MULTIVITAMIN-PRENATAL) 27-0.8 MG TABS tablet Take 1 tablet by mouth daily at 12 noon.   Yes [provider]  PROGESTERONE, VAGINAL, 8 % GEL Place vaginally. 11/11/18  Yes [provider]    Physical Exam BP 133/72   Pulse 68   Wt 227 lb (103 kg)   LMP 01/03/2019   BMI 42.89 kg/m   Physical Exam Vitals and nursing note reviewed. Exam conducted with a  chaperone present.  Constitutional:      General: She is not in acute distress.    Appearance: Normal appearance.  HENT:     Head: Normocephalic and atraumatic.  Eyes:     General: No scleral icterus.    Conjunctiva/sclera: Conjunctivae normal.  Cardiovascular:     Rate and Rhythm: Normal rate and regular rhythm.     Heart sounds: No murmur. No friction rub. No gallop.   Pulmonary:     Effort: No respiratory distress.     Breath sounds: Normal breath sounds. No wheezing, rhonchi or rales.  Abdominal:     General: There  is no distension.     Palpations: Abdomen is soft.     Tenderness: There is no abdominal tenderness. There is no guarding or rebound.  Genitourinary:    General: Normal vulva.     Exam position: Lithotomy position.     Tanner stage (genital): 5.     Labia:        Right: No rash, tenderness, lesion or injury.        Left: No rash, tenderness, lesion or injury.      Vagina: Normal.     Cervix: Normal.     Uterus: Normal.      Adnexa: Right adnexa normal and left adnexa normal.  Musculoskeletal:        General: No swelling or tenderness. Normal range of motion.  Skin:    General: Skin is warm and dry.     Coloration: Skin is not jaundiced.  Neurological:     General: No focal deficit present.     Mental Status: She is alert and oriented to person, place, and time.     Cranial Nerves: No cranial nerve deficit.  Psychiatric:        Mood and Affect: Mood normal.        Behavior: Behavior normal.        Judgment: Judgment normal.   BSUS: Single, living IUP at [redacted]w[redacted]d with HR 171 bpm  Female Chaperone present during breast and/or pelvic exam.   Assessment: 34 y.o. G2P1001 at [redacted]w[redacted]d presenting to initiate prenatal care  Plan: 1) Avoid alcoholic beverages. 2) Patient encouraged not to smoke.  3) Discontinue the use of all non-medicinal drugs and chemicals.  4) Take prenatal vitamins daily.  5) Nutrition, food safety (fish, cheese advisories, and high nitrite  foods) and exercise discussed. 6) Hospital and practice style discussed with cross coverage system.  7) Genetic Screening, such as with 1st Trimester Screening, cell free fetal DNA, AFP testing, and Ultrasound, as well as with amniocentesis and CVS as appropriate, is discussed with patient. At the conclusion of today's visit patient requested genetic testing 8) Patient is asked about travel to areas at risk for the Zika virus, and counseled to avoid travel and exposure to mosquitoes or sexual partners who may have themselves been exposed to the virus. Testing is discussed, and will be ordered as appropriate.  9) Fetal echo due to ART at 22 weeks 10) Early glucose tolerance test   Thomasene Mohair, MD 02/27/2019 8:30 AM

## 2019-03-01 LAB — URINE DRUG PANEL 7
Amphetamines, Urine: NEGATIVE ng/mL
Barbiturate Quant, Ur: NEGATIVE ng/mL
Benzodiazepine Quant, Ur: NEGATIVE ng/mL
Cannabinoid Quant, Ur: NEGATIVE ng/mL
Cocaine (Metab.): NEGATIVE ng/mL
Opiate Quant, Ur: NEGATIVE ng/mL
PCP Quant, Ur: NEGATIVE ng/mL

## 2019-03-01 LAB — URINE CULTURE

## 2019-03-04 LAB — CYTOLOGY - PAP
Chlamydia: NEGATIVE
Comment: NEGATIVE
Comment: NEGATIVE
Comment: NORMAL
Diagnosis: NEGATIVE
High risk HPV: NEGATIVE
Neisseria Gonorrhea: NEGATIVE

## 2019-03-06 ENCOUNTER — Ambulatory Visit (INDEPENDENT_AMBULATORY_CARE_PROVIDER_SITE_OTHER): Payer: BC Managed Care – PPO | Admitting: Obstetrics and Gynecology

## 2019-03-06 ENCOUNTER — Encounter: Payer: Self-pay | Admitting: Obstetrics and Gynecology

## 2019-03-06 ENCOUNTER — Other Ambulatory Visit: Payer: Self-pay

## 2019-03-06 VITALS — BP 127/67 | Wt 228.0 lb

## 2019-03-06 DIAGNOSIS — O34219 Maternal care for unspecified type scar from previous cesarean delivery: Secondary | ICD-10-CM

## 2019-03-06 DIAGNOSIS — Z6841 Body Mass Index (BMI) 40.0 and over, adult: Secondary | ICD-10-CM

## 2019-03-06 DIAGNOSIS — Z1379 Encounter for other screening for genetic and chromosomal anomalies: Secondary | ICD-10-CM

## 2019-03-06 DIAGNOSIS — O0990 Supervision of high risk pregnancy, unspecified, unspecified trimester: Secondary | ICD-10-CM

## 2019-03-06 DIAGNOSIS — O09811 Supervision of pregnancy resulting from assisted reproductive technology, first trimester: Secondary | ICD-10-CM | POA: Diagnosis not present

## 2019-03-06 DIAGNOSIS — O99211 Obesity complicating pregnancy, first trimester: Secondary | ICD-10-CM | POA: Diagnosis not present

## 2019-03-06 DIAGNOSIS — Z3A10 10 weeks gestation of pregnancy: Secondary | ICD-10-CM | POA: Diagnosis not present

## 2019-03-06 LAB — OB RESULTS CONSOLE VARICELLA ZOSTER ANTIBODY, IGG: Varicella: IMMUNE

## 2019-03-06 NOTE — Progress Notes (Signed)
Routine Prenatal Care Visit  Subjective  Tammy Chase is a 34 y.o. G2P1001 at [redacted]w[redacted]d being seen today for ongoing prenatal care.  She is currently monitored for the following issues for this high-risk pregnancy and has Pregnancy; Infertility, anovulation; Irregular menstrual bleeding; Supervision of high risk pregnancy, antepartum; Pregnancy conceived through in vitro fertilization; Obesity affecting pregnancy; BMI 40.0-44.9, adult (HCC); and History of cesarean delivery affecting pregnancy on their problem list.  ----------------------------------------------------------------------------------- Patient reports no complaints.    . Vag. Bleeding: None.   . Leaking Fluid denies.  She has continued to take estrogen and progesterone per [facility]'s recommendations.  ----------------------------------------------------------------------------------- The following portions of the patient's history were reviewed and updated as appropriate: allergies, current medications, past family history, past medical history, past social history, past surgical history and problem list. Problem list updated.  Objective  Blood pressure 127/67, weight 228 lb (103.4 kg), last menstrual period 01/03/2019. Pregravid weight 225 lb (102.1 kg) Total Weight Gain 3 lb (1.361 kg) Urinalysis: Urine Protein    Urine Glucose    Fetal Status: Fetal Heart Rate (bpm): 160         General:  Alert, oriented and cooperative. Patient is in no acute distress.  Skin: Skin is warm and dry. No rash noted.   Cardiovascular: Normal heart rate noted  Respiratory: Normal respiratory effort, no problems with respiration noted  Abdomen: Soft, gravid, appropriate for gestational age.       Pelvic:  Cervical exam deferred        Extremities: Normal range of motion.     Mental Status: Normal mood and affect. Normal behavior. Normal judgment and thought content.   Assessment   34 y.o. G2P1001 at [redacted]w[redacted]d by  10/01/2019, by Other Basis presenting  for routine prenatal visit  Plan   Pregnancy#2 Problems (from 01/03/19 to present)    Problem Noted Resolved   Supervision of high risk pregnancy, antepartum 02/27/2019 by Conard Novak, MD No   Overview Signed 02/27/2019 11:47 AM by Conard Novak, MD    [facility] Prenatal Labs  Dating  Blood type:     Genetic Screen 1 Screen:    AFP:     Quad:     NIPS: Antibody:   Anatomic [place]  Rubella:   Varicella: @VZVIGG @  GTT Early:               Third trimester:  RPR:     Rhogam  HBsAg:     TDaP vaccine                       Flu Shot: HIV:     Baby Food                                GBS:   Contraception  Pap:  CBB     CS/VBAC    Support Person            Pregnancy conceived through in vitro fertilization 02/27/2019 by 03/01/2019, MD No   Overview Signed 02/27/2019 11:47 AM by 03/01/2019, MD    []  fetal echo at 22 weeks - declined PGS - day 5 embryo transfer - on estrogen patches and vaginal progesterone until 10 weeks per [facility].      Obesity affecting pregnancy 02/27/2019 by , MD No   Overview Signed 02/27/2019 11:48 AM by Conard Novak, MD    [ ]   early 1h gtt      BMI 40.0-44.9, adult ([place]) 02/27/2019 by Will Bonnet, MD No   History of cesarean delivery affecting pregnancy 02/27/2019 by Will Bonnet, MD No   Overview Signed 02/27/2019 11:48 AM by Will Bonnet, MD    [ ]  decide of TOLAC          Preterm labor symptoms and general obstetric precautions including but not limited to vaginal bleeding, contractions, leaking of fluid and fetal movement were reviewed in detail with the patient. Please refer to After Visit Summary for other counseling recommendations.   Return in about 1 week (around 03/13/2019) for Routine Prenatal Appointment (may make weekly ob appts for several weeks).  Prentice Docker, MD, [facility], [facility] 03/06/2019 8:46 AM

## 2019-03-07 LAB — RPR+RH+ABO+RUB AB+AB SCR+CB...
Antibody Screen: NEGATIVE
HIV Screen 4th Generation wRfx: NONREACTIVE
Hematocrit: 38.8 % (ref 34.0–46.6)
Hemoglobin: 12.6 g/dL (ref 11.1–15.9)
Hepatitis B Surface Ag: NEGATIVE
MCH: 25.3 pg — ABNORMAL LOW (ref 26.6–33.0)
MCHC: 32.5 g/dL (ref 31.5–35.7)
MCV: 78 fL — ABNORMAL LOW (ref 79–97)
Platelets: 187 10*3/uL (ref 150–450)
RBC: 4.99 x10E6/uL (ref 3.77–5.28)
RDW: 16 % — ABNORMAL HIGH (ref 11.7–15.4)
RPR Ser Ql: NONREACTIVE
Rh Factor: NEGATIVE
Rubella Antibodies, IGG: 3.52 index (ref >0.99)
Varicella zoster IgG: 2282 index (ref >165)
WBC: 8.4 10*3/uL (ref 3.4–10.8)

## 2019-03-07 LAB — GLUCOSE, 1 HOUR GESTATIONAL: Gestational Diabetes Screen: 115 mg/dL (ref 65–139)

## 2019-03-11 LAB — MATERNIT21 PLUS CORE+SCA
Fetal Fraction: 5
Monosomy X (Turner Syndrome): NOT DETECTED
Result (T21): NEGATIVE
Trisomy 13 (Patau syndrome): NEGATIVE
Trisomy 18 (Edwards syndrome): NEGATIVE
Trisomy 21 (Down syndrome): NEGATIVE
XXX (Triple X Syndrome): NOT DETECTED
XXY (Klinefelter Syndrome): NOT DETECTED
XYY (Jacobs Syndrome): NOT DETECTED

## 2019-03-12 ENCOUNTER — Ambulatory Visit (INDEPENDENT_AMBULATORY_CARE_PROVIDER_SITE_OTHER): Payer: BC Managed Care – PPO | Admitting: Obstetrics and Gynecology

## 2019-03-12 ENCOUNTER — Encounter: Payer: Self-pay | Admitting: Obstetrics and Gynecology

## 2019-03-12 ENCOUNTER — Other Ambulatory Visit: Payer: Self-pay

## 2019-03-12 VITALS — BP 133/71 | Wt 227.0 lb

## 2019-03-12 DIAGNOSIS — O0990 Supervision of high risk pregnancy, unspecified, unspecified trimester: Secondary | ICD-10-CM

## 2019-03-12 DIAGNOSIS — O26891 Other specified pregnancy related conditions, first trimester: Secondary | ICD-10-CM

## 2019-03-12 DIAGNOSIS — Z3A11 11 weeks gestation of pregnancy: Secondary | ICD-10-CM

## 2019-03-12 DIAGNOSIS — O09811 Supervision of pregnancy resulting from assisted reproductive technology, first trimester: Secondary | ICD-10-CM

## 2019-03-12 DIAGNOSIS — O99211 Obesity complicating pregnancy, first trimester: Secondary | ICD-10-CM

## 2019-03-12 DIAGNOSIS — O34219 Maternal care for unspecified type scar from previous cesarean delivery: Secondary | ICD-10-CM

## 2019-03-12 DIAGNOSIS — Z6791 Unspecified blood type, Rh negative: Secondary | ICD-10-CM | POA: Insufficient documentation

## 2019-03-12 DIAGNOSIS — Z6841 Body Mass Index (BMI) 40.0 and over, adult: Secondary | ICD-10-CM

## 2019-03-12 DIAGNOSIS — O219 Vomiting of pregnancy, unspecified: Secondary | ICD-10-CM

## 2019-03-12 DIAGNOSIS — O0991 Supervision of high risk pregnancy, unspecified, first trimester: Secondary | ICD-10-CM

## 2019-03-12 DIAGNOSIS — O26899 Other specified pregnancy related conditions, unspecified trimester: Secondary | ICD-10-CM | POA: Insufficient documentation

## 2019-03-12 LAB — POCT URINALYSIS DIPSTICK OB
Glucose, UA: NEGATIVE
POC,PROTEIN,UA: NEGATIVE

## 2019-03-12 MED ORDER — DICLEGIS 10-10 MG PO TBEC: 1.0000 | DELAYED_RELEASE_TABLET | ORAL | 2 refills | Status: DC

## 2019-03-12 NOTE — Progress Notes (Signed)
Routine Prenatal Care Visit  Subjective  Tammy Chase is a 34 y.o. G2P1001 at 108w0d being seen today for ongoing prenatal care.  She is currently monitored for the following issues for this high-risk pregnancy and has Pregnancy; Infertility, anovulation; Irregular menstrual bleeding; Supervision of high risk pregnancy, antepartum; Pregnancy conceived through in vitro fertilization; Obesity affecting pregnancy; BMI 40.0-44.9, adult ([place]); History of cesarean delivery affecting pregnancy; Rh negative state in antepartum period; and Nausea and vomiting during pregnancy on their problem list.  ----------------------------------------------------------------------------------- Patient reports no complaints.    . Vag. Bleeding: None.   . Leaking Fluid denies.  ----------------------------------------------------------------------------------- The following portions of the patient's history were reviewed and updated as appropriate: allergies, current medications, past family history, past medical history, past social history, past surgical history and problem list. Problem list updated.  Objective  Blood pressure 133/71, weight 227 lb (103 kg), last menstrual period 01/03/2019. Pregravid weight 225 lb (102.1 kg) Total Weight Gain 2 lb (0.907 kg) Urinalysis: Urine Protein Negative  Urine Glucose Negative  Fetal Status: Fetal Heart Rate (bpm): 150         General:  Alert, oriented and cooperative. Patient is in no acute distress.  Skin: Skin is warm and dry. No rash noted.   Cardiovascular: Normal heart rate noted  Respiratory: Normal respiratory effort, no problems with respiration noted  Abdomen: Soft, gravid, appropriate for gestational age.       Pelvic:  Cervical exam deferred        Extremities: Normal range of motion.     Mental Status: Normal mood and affect. Normal behavior. Normal judgment and thought content.   Assessment   34 y.o. G2P1001 at [redacted]w[redacted]d by  10/01/2019, by Other Basis  presenting for routine prenatal visit  Plan   Pregnancy#2 Problems (from 01/03/19 to present)    Problem Noted Resolved   Rh negative state in antepartum period 03/12/2019 by Will Bonnet, MD No   Overview Signed 03/12/2019  9:23 AM by Will Bonnet, MD    [ ]  Rhogam per protocol      Nausea and vomiting during pregnancy 03/12/2019 by Will Bonnet, MD No   Supervision of high risk pregnancy, antepartum 02/27/2019 by Will Bonnet, MD No   Overview Addendum 03/12/2019  9:24 AM by Will Bonnet, MD    [facility] Prenatal Labs  Dating Day 5 embryo xfer, 7wk [place] Blood type: A/Negative/-- (01/28 0916)   Genetic Screen 1 Screen:    AFP:     Quad:     NIPS: Antibody:Negative (01/28 0916)  Anatomic [place]  Rubella: 3.52 (01/28 0916)  Varicella: Immune  GTT Early: 115Third trimester: []  RPR: Non Reactive (01/28 0916)   Rhogam [ ]  28 weeks HBsAg: Negative (01/28 0916)   TDaP vaccine                       Flu Shot: HIV: Non Reactive (01/28 0916)   Baby Food                                GBS:   Contraception  Pap:  CBB     CS/VBAC [ ]  ? TOLAC vs RCS   Support Person            Pregnancy conceived through in vitro fertilization 02/27/2019 by Will Bonnet, MD No   Overview Signed 02/27/2019 11:47 AM by Will Bonnet,  MD    []  fetal echo at 22 weeks - declined PGS - day 5 embryo transfer - on estrogen patches and vaginal progesterone until 10 weeks per [facility].      Obesity affecting pregnancy 02/27/2019 by 03/01/2019, MD No   Overview Addendum 03/12/2019  9:23 AM by 05/10/2019, MD    [x]  early 1h gtt - 115      BMI 40.0-44.9, adult (HCC) 02/27/2019 by 11-19-1979, MD No   History of cesarean delivery affecting pregnancy 02/27/2019 by Conard Novak, MD No   Overview Signed 02/27/2019 11:48 AM by Conard Novak, MD    [ ]  decide of TOLAC          Preterm labor symptoms and general obstetric precautions including but not  limited to vaginal bleeding, contractions, leaking of fluid and fetal movement were reviewed in detail with the patient. Please refer to After Visit Summary for other counseling recommendations.   Return for Routine Prenatal Appointment.  03/01/2019, MD, Con[facility] Novak [facility], [facility] 03/12/2019 10:03 AM

## 2019-03-20 ENCOUNTER — Other Ambulatory Visit: Payer: Self-pay

## 2019-03-20 ENCOUNTER — Ambulatory Visit (INDEPENDENT_AMBULATORY_CARE_PROVIDER_SITE_OTHER): Payer: BC Managed Care – PPO | Admitting: Obstetrics and Gynecology

## 2019-03-20 ENCOUNTER — Encounter: Payer: Self-pay | Admitting: Obstetrics and Gynecology

## 2019-03-20 VITALS — BP 119/68 | Wt 225.0 lb

## 2019-03-20 DIAGNOSIS — O0991 Supervision of high risk pregnancy, unspecified, first trimester: Secondary | ICD-10-CM

## 2019-03-20 DIAGNOSIS — O09811 Supervision of pregnancy resulting from assisted reproductive technology, first trimester: Secondary | ICD-10-CM

## 2019-03-20 DIAGNOSIS — Z6791 Unspecified blood type, Rh negative: Secondary | ICD-10-CM

## 2019-03-20 DIAGNOSIS — O219 Vomiting of pregnancy, unspecified: Secondary | ICD-10-CM

## 2019-03-20 DIAGNOSIS — O34219 Maternal care for unspecified type scar from previous cesarean delivery: Secondary | ICD-10-CM

## 2019-03-20 DIAGNOSIS — O0990 Supervision of high risk pregnancy, unspecified, unspecified trimester: Secondary | ICD-10-CM

## 2019-03-20 DIAGNOSIS — O26891 Other specified pregnancy related conditions, first trimester: Secondary | ICD-10-CM

## 2019-03-20 DIAGNOSIS — O210 Mild hyperemesis gravidarum: Secondary | ICD-10-CM

## 2019-03-20 DIAGNOSIS — Z3A12 12 weeks gestation of pregnancy: Secondary | ICD-10-CM

## 2019-03-20 DIAGNOSIS — O99211 Obesity complicating pregnancy, first trimester: Secondary | ICD-10-CM

## 2019-03-20 DIAGNOSIS — Z6841 Body Mass Index (BMI) 40.0 and over, adult: Secondary | ICD-10-CM

## 2019-03-20 LAB — POCT URINALYSIS DIPSTICK OB
Glucose, UA: NEGATIVE
POC,PROTEIN,UA: NEGATIVE

## 2019-03-20 MED ORDER — METOCLOPRAMIDE HCL 10 MG PO TABS: 10.0000 mg | ORAL_TABLET | Freq: Three times a day (TID) | ORAL | 2 refills | Status: DC | PRN

## 2019-03-20 NOTE — Progress Notes (Signed)
Routine Prenatal Care Visit  Subjective  Tammy Chase is a 34 y.o. G2P1001 at [redacted]w[redacted]d being seen today for ongoing prenatal care.  She is currently monitored for the following issues for this high-risk pregnancy and has Pregnancy; Infertility, anovulation; Irregular menstrual bleeding; Supervision of high risk pregnancy, antepartum; Pregnancy conceived through in vitro fertilization; Obesity affecting pregnancy; BMI 40.0-44.9, adult ([place]); History of cesarean delivery affecting pregnancy; Rh negative state in antepartum period; and Nausea and vomiting during pregnancy on their problem list.  ----------------------------------------------------------------------------------- Patient reports nausea, not greatly improved by Diclegis. She is trying to use conservative methods, as well.    . Vag. Bleeding: None.   . Leaking Fluid denies.  ----------------------------------------------------------------------------------- The following portions of the patient's history were reviewed and updated as appropriate: allergies, current medications, past family history, past medical history, past social history, past surgical history and problem list. Problem list updated.  Objective  Blood pressure 119/68, weight 225 lb (102.1 kg), last menstrual period 01/03/2019. Pregravid weight 225 lb (102.1 kg) Total Weight Gain 0 lb (0 kg) Urinalysis: Urine Protein Negative  Urine Glucose Negative  Fetal Status: Fetal Heart Rate (bpm): 150         General:  Alert, oriented and cooperative. Patient is in no acute distress.  Skin: Skin is warm and dry. No rash noted.   Cardiovascular: Normal heart rate noted  Respiratory: Normal respiratory effort, no problems with respiration noted  Abdomen: Soft, gravid, appropriate for gestational age.       Pelvic:  Cervical exam deferred        Extremities: Normal range of motion.     Mental Status: Normal mood and affect. Normal behavior. Normal judgment and thought content.    Assessment   34 y.o. G2P1001 at [redacted]w[redacted]d by  10/01/2019, by Other Basis presenting for routine prenatal visit  Plan   Pregnancy#2 Problems (from 01/03/19 to present)    Problem Noted Resolved   Rh negative state in antepartum period 03/12/2019 by Will Bonnet, MD No   Overview Signed 03/12/2019  9:23 AM by Will Bonnet, MD    [ ]  Rhogam per protocol      Nausea and vomiting during pregnancy 03/12/2019 by Will Bonnet, MD No   Supervision of high risk pregnancy, antepartum 02/27/2019 by Will Bonnet, MD No   Overview Addendum 03/20/2019  8:23 AM by Will Bonnet, MD    [facility] Prenatal [facility]  Dating Day 5 embryo xfer, 7wk [place] Blood type: A/Negative/-- (01/28 0916)   Genetic Screen AFP:          NIPS: diploid XX Antibody:Negative (01/28 0916)  Anatomic [place]  Rubella: 3.52 (01/28 0916)  Varicella: Immune  GTT Early: 115Third trimester: []  RPR: Non Reactive (01/28 0916)   Rhogam [ ]  28 weeks HBsAg: Negative (01/28 0916)   TDaP vaccine                       Flu Shot: HIV: Non Reactive (01/28 0916)   Baby Food                                GBS:   Contraception  Pap:  CBB     CS/VBAC [ ]  ? TOLAC vs RCS   Support Person            Pregnancy conceived through in vitro fertilization 02/27/2019 by Will Bonnet, MD No  Overview Signed 02/27/2019 11:47 AM by Conard Novak, MD    []  fetal echo at 22 weeks - declined PGS - day 5 embryo transfer - on estrogen patches and vaginal progesterone until 10 weeks per [facility].      Obesity affecting pregnancy 02/27/2019 by 03/01/2019, MD No   Overview Addendum 03/12/2019  9:23 AM by 05/10/2019, MD    [x]  early 1h gtt - 115      BMI 40.0-44.9, adult (HCC) 02/27/2019 by [birth year]1981, MD No   History of cesarean delivery affecting pregnancy 02/27/2019 by Conard Novak, MD No   Overview Signed 02/27/2019 11:48 AM by Conard Novak, MD    [ ]  decide of TOLAC          Preterm  labor symptoms and general obstetric precautions including but not limited to vaginal bleeding, contractions, leaking of fluid and fetal movement were reviewed in detail with the patient. Please refer to After Visit Summary for other counseling recommendations.   - Reviewed conservative measures for nausea. Encouraged hydration, especially with electrolytes, if having more serious issues.  Rx for Reglan provided.  Will consider zofran in 2nd trimester, if issue continues.   Return in about 2 weeks (around 04/03/2019) for Routine Prenatal Appointment.  Conard Novak, MD, OB/GYN, [facility] 03/20/2019 8:43 AM

## 2019-03-31 ENCOUNTER — Other Ambulatory Visit: Payer: Self-pay

## 2019-03-31 ENCOUNTER — Encounter: Payer: Self-pay | Admitting: Obstetrics and Gynecology

## 2019-03-31 ENCOUNTER — Ambulatory Visit (INDEPENDENT_AMBULATORY_CARE_PROVIDER_SITE_OTHER): Payer: BC Managed Care – PPO | Admitting: Obstetrics and Gynecology

## 2019-03-31 VITALS — BP 130/60 | Wt 227.0 lb

## 2019-03-31 DIAGNOSIS — Z3A13 13 weeks gestation of pregnancy: Secondary | ICD-10-CM

## 2019-03-31 DIAGNOSIS — O0990 Supervision of high risk pregnancy, unspecified, unspecified trimester: Secondary | ICD-10-CM

## 2019-03-31 DIAGNOSIS — Z6841 Body Mass Index (BMI) 40.0 and over, adult: Secondary | ICD-10-CM

## 2019-03-31 DIAGNOSIS — O09812 Supervision of pregnancy resulting from assisted reproductive technology, second trimester: Secondary | ICD-10-CM

## 2019-03-31 DIAGNOSIS — Z6791 Unspecified blood type, Rh negative: Secondary | ICD-10-CM

## 2019-03-31 DIAGNOSIS — O26899 Other specified pregnancy related conditions, unspecified trimester: Secondary | ICD-10-CM

## 2019-03-31 DIAGNOSIS — O99211 Obesity complicating pregnancy, first trimester: Secondary | ICD-10-CM

## 2019-03-31 DIAGNOSIS — O219 Vomiting of pregnancy, unspecified: Secondary | ICD-10-CM

## 2019-03-31 DIAGNOSIS — O09811 Supervision of pregnancy resulting from assisted reproductive technology, first trimester: Secondary | ICD-10-CM

## 2019-03-31 DIAGNOSIS — O34219 Maternal care for unspecified type scar from previous cesarean delivery: Secondary | ICD-10-CM

## 2019-03-31 DIAGNOSIS — O26892 Other specified pregnancy related conditions, second trimester: Secondary | ICD-10-CM

## 2019-03-31 DIAGNOSIS — O99212 Obesity complicating pregnancy, second trimester: Secondary | ICD-10-CM

## 2019-03-31 DIAGNOSIS — O09892 Supervision of other high risk pregnancies, second trimester: Secondary | ICD-10-CM

## 2019-03-31 NOTE — Progress Notes (Signed)
Routine Prenatal Care Visit  Subjective  Tammy Chase is a 34 y.o. G2P1001 at [redacted]w[redacted]d being seen today for ongoing prenatal care.  She is currently monitored for the following issues for this high-risk pregnancy and has Pregnancy; Infertility, anovulation; Irregular menstrual bleeding; Supervision of high risk pregnancy, antepartum; Pregnancy conceived through in vitro fertilization; Obesity affecting pregnancy; BMI 40.0-44.9, adult ([place]); History of cesarean delivery affecting pregnancy; Rh negative state in antepartum period; and Nausea and vomiting during pregnancy on their problem list.  ----------------------------------------------------------------------------------- Patient reports no complaints.    . Vag. Bleeding: None.  Movement: Absent. Leaking Fluid denies.  ----------------------------------------------------------------------------------- The following portions of the patient's history were reviewed and updated as appropriate: allergies, current medications, past family history, past medical history, past social history, past surgical history and problem list. Problem list updated.  Objective  Blood pressure 130/60, weight 227 lb (103 kg), last menstrual period 01/03/2019. Pregravid weight 225 lb (102.1 kg) Total Weight Gain 2 lb (0.907 kg) Urinalysis: Urine Protein    Urine Glucose    Fetal Status: Fetal Heart Rate (bpm): 140   Movement: Absent     General:  Alert, oriented and cooperative. Patient is in no acute distress.  Skin: Skin is warm and dry. No rash noted.   Cardiovascular: Normal heart rate noted  Respiratory: Normal respiratory effort, no problems with respiration noted  Abdomen: Soft, gravid, appropriate for gestational age.       Pelvic:  Cervical exam deferred        Extremities: Normal range of motion.     Mental Status: Normal mood and affect. Normal behavior. Normal judgment and thought content.   Assessment   34 y.o. G2P1001 at [redacted]w[redacted]d by  10/01/2019, by  Other Basis presenting for routine prenatal visit  Plan   Pregnancy#2 Problems (from 01/03/19 to present)    Problem Noted Resolved   Rh negative state in antepartum period 03/12/2019 by Will Bonnet, MD No   Overview Signed 03/12/2019  9:23 AM by Will Bonnet, MD    [ ]  Rhogam per protocol      Nausea and vomiting during pregnancy 03/12/2019 by Will Bonnet, MD No   Supervision of high risk pregnancy, antepartum 02/27/2019 by Will Bonnet, MD No   Overview Addendum 03/20/2019  8:23 AM by Will Bonnet, MD    [facility] Prenatal Labs  Dating Day 5 embryo xfer, 7wk [place] Blood type: A/Negative/-- (01/28 0916)   Genetic Screen AFP:          NIPS: diploid XX Antibody:Negative (01/28 0916)  Anatomic [place]  Rubella: 3.52 (01/28 0916)  Varicella: Immune  GTT Early: 115Third trimester: []  RPR: Non Reactive (01/28 0916)   Rhogam [ ]  28 weeks HBsAg: Negative (01/28 0916)   TDaP vaccine                       Flu Shot: HIV: Non Reactive (01/28 0916)   Baby Food                                GBS:   Contraception  Pap:  CBB     CS/VBAC [ ]  ? TOLAC vs RCS   Support Person            Pregnancy conceived through in vitro fertilization 02/27/2019 by Will Bonnet, MD No   Overview Signed 02/27/2019 11:47 AM by Will Bonnet, MD    []   fetal echo at 22 weeks - declined PGS - day 5 embryo transfer - on estrogen patches and vaginal progesterone until 10 weeks per [facility].      Obesity affecting pregnancy 02/27/2019 by Conard Novak, MD No   Overview Addendum 03/12/2019  9:23 AM by Conard Novak, MD    [x]  early 1h gtt - 115      BMI 40.0-44.9, adult (HCC) 02/27/2019 by 03/01/2019, MD No   History of cesarean delivery affecting pregnancy 02/27/2019 by 03/01/2019, MD No   Overview Signed 02/27/2019 11:48 AM by 03/01/2019, MD    [ ]  decide of TOLAC          Preterm labor symptoms and general obstetric precautions including but not  limited to vaginal bleeding, contractions, leaking of fluid and fetal movement were reviewed in detail with the patient. Please refer to After Visit Summary for other counseling recommendations.   Return in about 2 weeks (around 04/14/2019) for Routine Prenatal Appointment.  , MD, 06/14/2019 OB/GYN, [facility] 03/31/2019 11:24 AM

## 2019-04-16 ENCOUNTER — Ambulatory Visit (INDEPENDENT_AMBULATORY_CARE_PROVIDER_SITE_OTHER): Payer: BC Managed Care – PPO | Admitting: Obstetrics and Gynecology

## 2019-04-16 ENCOUNTER — Other Ambulatory Visit: Payer: Self-pay

## 2019-04-16 VITALS — BP 135/72 | Wt 225.0 lb

## 2019-04-16 DIAGNOSIS — Z3A16 16 weeks gestation of pregnancy: Secondary | ICD-10-CM

## 2019-04-16 DIAGNOSIS — O09812 Supervision of pregnancy resulting from assisted reproductive technology, second trimester: Secondary | ICD-10-CM

## 2019-04-16 DIAGNOSIS — O0990 Supervision of high risk pregnancy, unspecified, unspecified trimester: Secondary | ICD-10-CM

## 2019-04-16 DIAGNOSIS — O34219 Maternal care for unspecified type scar from previous cesarean delivery: Secondary | ICD-10-CM

## 2019-04-16 DIAGNOSIS — Z363 Encounter for antenatal screening for malformations: Secondary | ICD-10-CM

## 2019-04-16 DIAGNOSIS — O09892 Supervision of other high risk pregnancies, second trimester: Secondary | ICD-10-CM

## 2019-04-16 DIAGNOSIS — O09811 Supervision of pregnancy resulting from assisted reproductive technology, first trimester: Secondary | ICD-10-CM

## 2019-04-16 LAB — POCT URINALYSIS DIPSTICK OB
Glucose, UA: NEGATIVE
POC,PROTEIN,UA: NEGATIVE

## 2019-04-16 NOTE — Progress Notes (Signed)
ROB

## 2019-04-16 NOTE — Progress Notes (Signed)
Routine Prenatal Care Visit  Subjective  Tammy Chase is a 34 y.o. G2P1001 at [redacted]w[redacted]d being seen today for ongoing prenatal care.  She is currently monitored for the following issues for this high-risk pregnancy and has Pregnancy; Infertility, anovulation; Irregular menstrual bleeding; Supervision of high risk pregnancy, antepartum; Pregnancy conceived through in vitro fertilization; Obesity affecting pregnancy; BMI 40.0-44.9, adult ([place]); History of cesarean delivery affecting pregnancy; Rh negative state in antepartum period; and Nausea and vomiting during pregnancy on their problem list.  ----------------------------------------------------------------------------------- Patient reports no complaints.   Still having nausea.  Some headaches Contractions: Not present. Vag. Bleeding: None.  Movement: Absent. Denies leaking of fluid.  ----------------------------------------------------------------------------------- The following portions of the patient's history were reviewed and updated as appropriate: allergies, current medications, past family history, past medical history, past social history, past surgical history and problem list. Problem list updated.   Objective  Blood pressure 135/72, weight 225 lb (102.1 kg), last menstrual period 01/03/2019. Pregravid weight 225 lb (102.1 kg) Total Weight Gain 0 lb (0 kg) Urinalysis:      Fetal Status: Fetal Heart Rate (bpm): 145   Movement: Absent     General:  Alert, oriented and cooperative. Patient is in no acute distress.  Skin: Skin is warm and dry. No rash noted.   Cardiovascular: Normal heart rate noted  Respiratory: Normal respiratory effort, no problems with respiration noted  Abdomen: Soft, gravid, appropriate for gestational age. Pain/Pressure: Absent     Pelvic:  Cervical exam deferred        Extremities: Normal range of motion.     ental Status: Normal mood and affect. Normal behavior. Normal judgment and thought content.    FHT by bedsides ultrasound 145, with CRL [redacted]w[redacted]d, anterior placenta.  Assessment   34 y.o. G2P1001 at [redacted]w[redacted]d by  10/01/2019, by Other Basis presenting for routine prenatal visit  Plan   Pregnancy#2 Problems (from 01/03/19 to present)    Problem Noted Resolved   Rh negative state in antepartum period 03/12/2019 by Will Bonnet, MD No   Overview Signed 03/12/2019  9:23 AM by Will Bonnet, MD    [ ]  Rhogam per protocol      Nausea and vomiting during pregnancy 03/12/2019 by Will Bonnet, MD No   Supervision of high risk pregnancy, antepartum 02/27/2019 by Will Bonnet, MD No   Overview Addendum 03/20/2019  8:23 AM by Will Bonnet, MD    [facility] P[facility]atal Labs  Dating Day 5 embryo xfer, 7wk [place] Blood type: A/Negative/-- (01/28 0916)   Genetic Screen AFP:          NIPS: diploid XX Antibody:Negative (01/28 0916)  Anatomic [place]  Rubella: 3.52 (01/28 0916)  Varicella: Immune  GTT Early: 115Third trimester: []  RPR: Non Reactive (01/28 0916)   Rhogam [ ]  28 weeks HBsAg: Negative (01/28 0916)   TDaP vaccine                       Flu Shot: HIV: Non Reactive (01/28 0916)   Baby Food                                GBS:   Contraception  Pap:  CBB     CS/VBAC [ ]  ? TOLAC vs RCS   Support Person            Pregnancy conceived through in vitro fertilization 02/27/2019 by  Conard Novak, MD No   Overview Signed 02/27/2019 11:47 AM by Conard Novak, MD    []  fetal echo at 22 weeks - declined PGS - day 5 embryo transfer - on estrogen patches and vaginal progesterone until 10 weeks per [facility].      Obesity affecting pregnancy 02/27/2019 by 03/01/2019, MD No   Overview Addendum 03/12/2019  9:23 AM by 05/10/2019, MD    [x]  early 1h gtt - 115      BMI 40.0-44.9, adult (HCC) 02/27/2019 by 11-19-1981, MD No   History of cesarean delivery affecting pregnancy 02/27/2019 by Conard Novak, MD No   Overview Signed 02/27/2019 11:48 AM by  Conard Novak, MD    [ ]  decide of TOLAC          Gestational age appropriate obstetric precautions including but not limited to vaginal bleeding, contractions, leaking of fluid and fetal movement were reviewed in detail with the patient.    - anatomy scan next visit - fetal echo referral placed  Return in about 4 weeks (around 05/14/2019) for ROB and anatomy.  Conard Novak, MD, OB/GYN, [facility] 04/16/2019, 9:42 AM

## 2019-05-14 ENCOUNTER — Other Ambulatory Visit: Payer: BC Managed Care – PPO

## 2019-05-14 ENCOUNTER — Encounter: Payer: BC Managed Care – PPO | Admitting: Obstetrics and Gynecology

## 2019-05-15 ENCOUNTER — Other Ambulatory Visit: Payer: Self-pay

## 2019-05-15 ENCOUNTER — Ambulatory Visit (INDEPENDENT_AMBULATORY_CARE_PROVIDER_SITE_OTHER): Payer: BC Managed Care – PPO | Admitting: Certified Nurse Midwife

## 2019-05-15 ENCOUNTER — Ambulatory Visit (INDEPENDENT_AMBULATORY_CARE_PROVIDER_SITE_OTHER): Payer: BC Managed Care – PPO

## 2019-05-15 VITALS — BP 120/80 | Wt 227.0 lb

## 2019-05-15 DIAGNOSIS — O0990 Supervision of high risk pregnancy, unspecified, unspecified trimester: Secondary | ICD-10-CM

## 2019-05-15 DIAGNOSIS — Z363 Encounter for antenatal screening for malformations: Secondary | ICD-10-CM

## 2019-05-15 DIAGNOSIS — O09811 Supervision of pregnancy resulting from assisted reproductive technology, first trimester: Secondary | ICD-10-CM

## 2019-05-15 DIAGNOSIS — O219 Vomiting of pregnancy, unspecified: Secondary | ICD-10-CM

## 2019-05-15 DIAGNOSIS — O321XX0 Maternal care for breech presentation, not applicable or unspecified: Secondary | ICD-10-CM | POA: Diagnosis not present

## 2019-05-15 DIAGNOSIS — Z3A19 19 weeks gestation of pregnancy: Secondary | ICD-10-CM | POA: Diagnosis not present

## 2019-05-15 DIAGNOSIS — O0992 Supervision of high risk pregnancy, unspecified, second trimester: Secondary | ICD-10-CM

## 2019-05-15 DIAGNOSIS — Z3A20 20 weeks gestation of pregnancy: Secondary | ICD-10-CM

## 2019-05-15 NOTE — Progress Notes (Signed)
ROB/anatomy  C/o nausea Denies lof, no vb, no FM

## 2019-05-17 NOTE — Progress Notes (Signed)
ROB/anatomy scan at [facility]: Complains of bitemporal headaches at night, unrelieved with Tylenol. 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 Recommend Excedrin Tension Headache medication OTC.  Still having nausea during the day. Has been taking both Diclegis and Reglan at night at hs. Encouraged to take Diclegis at night and Reglan prior to meals during the day to increase her fluid and food intake which may help headaches.  Has gained 2# with pregnancy. Not feeling FM yet. Anatomy scan: CGA 19wk5d, breech, anterior placenta, normal anatomy  A: IUP at [facility] S=D  P: ROB at 4 weeks Excedrin Tension Headache OTC, if not effective, will send in RX for Fioricet Diclegis at night and Reglan prior to meals during the day Fetal echo scheduled  Farrel Conners, CNM

## 2019-06-12 ENCOUNTER — Other Ambulatory Visit: Payer: Self-pay

## 2019-06-12 ENCOUNTER — Encounter: Payer: Self-pay | Admitting: Obstetrics and Gynecology

## 2019-06-12 ENCOUNTER — Ambulatory Visit (INDEPENDENT_AMBULATORY_CARE_PROVIDER_SITE_OTHER): Payer: BC Managed Care – PPO | Admitting: Obstetrics and Gynecology

## 2019-06-12 VITALS — BP 122/76 | Wt 226.0 lb

## 2019-06-12 DIAGNOSIS — Z3A24 24 weeks gestation of pregnancy: Secondary | ICD-10-CM | POA: Diagnosis not present

## 2019-06-12 DIAGNOSIS — Z6791 Unspecified blood type, Rh negative: Secondary | ICD-10-CM

## 2019-06-12 DIAGNOSIS — Z113 Encounter for screening for infections with a predominantly sexual mode of transmission: Secondary | ICD-10-CM

## 2019-06-12 DIAGNOSIS — O26899 Other specified pregnancy related conditions, unspecified trimester: Secondary | ICD-10-CM

## 2019-06-12 DIAGNOSIS — O0992 Supervision of high risk pregnancy, unspecified, second trimester: Secondary | ICD-10-CM

## 2019-06-12 DIAGNOSIS — O09812 Supervision of pregnancy resulting from assisted reproductive technology, second trimester: Secondary | ICD-10-CM

## 2019-06-12 DIAGNOSIS — O99212 Obesity complicating pregnancy, second trimester: Secondary | ICD-10-CM

## 2019-06-12 DIAGNOSIS — Z131 Encounter for screening for diabetes mellitus: Secondary | ICD-10-CM

## 2019-06-12 DIAGNOSIS — O34219 Maternal care for unspecified type scar from previous cesarean delivery: Secondary | ICD-10-CM

## 2019-06-12 DIAGNOSIS — O219 Vomiting of pregnancy, unspecified: Secondary | ICD-10-CM

## 2019-06-12 LAB — POCT URINALYSIS DIPSTICK OB: Glucose, UA: NEGATIVE

## 2019-06-12 NOTE — Progress Notes (Signed)
Routine Prenatal Care Visit  Subjective  Tammy Chase is a 34 y.o. G2P1001 at [redacted]w[redacted]d being seen today for ongoing prenatal care.  She is currently monitored for the following issues for this high-risk pregnancy and has Pregnancy; Infertility, anovulation; Irregular menstrual bleeding; Supervision of high risk pregnancy, antepartum; Pregnancy conceived through in vitro fertilization; Obesity affecting pregnancy; BMI 40.0-44.9, adult ([place]); History of cesarean delivery affecting pregnancy; Rh negative state in antepartum period; and Nausea and vomiting during pregnancy on their problem list.  ----------------------------------------------------------------------------------- Patient reports no complaints.   Contractions: Not present. Vag. Bleeding: None.  Movement: Present. Leaking Fluid denies.  ----------------------------------------------------------------------------------- The following portions of the patient's history were reviewed and updated as appropriate: allergies, current medications, past family history, past medical history, past social history, past surgical history and problem list. Problem list updated.  Objective  Blood pressure 122/76, weight 226 lb (102.5 kg), last menstrual period 01/03/2019. Pregravid weight 225 lb (102.1 kg) Total Weight Gain 1 lb (0.454 kg) Urinalysis: Urine Protein Trace  Urine Glucose Negative  Fetal Status: Fetal Heart Rate (bpm): 140 Fundal Height: 24 cm Movement: Present     General:  Alert, oriented and cooperative. Patient is in no acute distress.  Skin: Skin is warm and dry. No rash noted.   Cardiovascular: Normal heart rate noted  Respiratory: Normal respiratory effort, no problems with respiration noted  Abdomen: Soft, gravid, appropriate for gestational age. Pain/Pressure: Absent     Pelvic:  Cervical exam deferred        Extremities: Normal range of motion.     Mental Status: Normal mood and affect. Normal behavior. Normal judgment and  thought content.   Assessment   34 y.o. G2P1001 at [redacted]w[redacted]d by  10/01/2019, by Other Basis presenting for routine prenatal visit  Plan   Pregnancy#2 Problems (from 01/03/19 to present)    Problem Noted Resolved   Rh negative state in antepartum period 03/12/2019 by Will Bonnet, MD No   Overview Signed 03/12/2019  9:23 AM by Will Bonnet, MD    [ ]  Rhogam per protocol      Nausea and vomiting during pregnancy 03/12/2019 by Will Bonnet, MD No   Supervision of high risk pregnancy, antepartum 02/27/2019 by Will Bonnet, MD No   Overview Addendum 03/20/2019  8:23 AM by Will Bonnet, MD    Clinic [facility] Prenatal Labs  Dating Day 5 embryo xfer, 7wk [place] Blood type: A/Negative/-- (01/28 0916)   Genetic Screen AFP:          NIPS: diploid XX Antibody:Negative (01/28 0916)  Anatomic [place]  Rubella: 3.52 (01/28 0916)  Varicella: Immune  GTT Early: 115Third trimester: []  RPR: Non Reactive (01/28 0916)   Rhogam [ ]  28 weeks HBsAg: Negative (01/28 0916)   TDaP vaccine                       Flu Shot: HIV: Non Reactive (01/28 0916)   Baby Food                                GBS:   Contraception  Pap:  CBB     CS/VBAC [ ]  ? TOLAC vs RCS   Support Person            Pregnancy conceived through in vitro fertilization 02/27/2019 by Will Bonnet, MD No   Overview Signed 02/27/2019 11:47 AM by Will Bonnet,  MD    []  fetal echo at 22 weeks - declined PGS - day 5 embryo transfer - on estrogen patches and vaginal progesterone until 10 weeks per [facility].      Obesity affecting pregnancy 02/27/2019 by 03/01/2019, MD No   Overview Addendum 03/12/2019  9:23 AM by 05/10/2019, MD    [x]  early 1h gtt - 115      BMI 40.0-44.9, adult ([facility]) 02/27/2019 by 11-19-1979, MD No   History of cesarean delivery affecting pregnancy 02/27/2019 by Conard Novak, MD No   Overview Signed 02/27/2019 11:48 AM by Conard Novak, MD    [ ]  decide of TOLAC           Preterm labor symptoms and general obstetric precautions including but not limited to vaginal bleeding, contractions, leaking of fluid and fetal movement were reviewed in detail with the patient. Please refer to After Visit Summary for other counseling recommendations.   - Rescheduled fetal echo. Is supposed to be tomorrow.   Return in about 4 weeks (around 07/10/2019) for U/S for growth/afi, 28 week labs, routine prenatal.  Conard Novak, MD, OB/GYN, [facility] 06/12/2019 8:38 AM

## 2019-07-14 ENCOUNTER — Other Ambulatory Visit: Payer: BC Managed Care – PPO

## 2019-07-14 ENCOUNTER — Other Ambulatory Visit: Payer: Self-pay

## 2019-07-14 ENCOUNTER — Ambulatory Visit (INDEPENDENT_AMBULATORY_CARE_PROVIDER_SITE_OTHER): Payer: BC Managed Care – PPO | Admitting: Obstetrics and Gynecology

## 2019-07-14 ENCOUNTER — Encounter: Payer: Self-pay | Admitting: Obstetrics and Gynecology

## 2019-07-14 ENCOUNTER — Ambulatory Visit (INDEPENDENT_AMBULATORY_CARE_PROVIDER_SITE_OTHER): Payer: BC Managed Care – PPO

## 2019-07-14 VITALS — BP 124/80 | Wt 227.0 lb

## 2019-07-14 DIAGNOSIS — Z6791 Unspecified blood type, Rh negative: Secondary | ICD-10-CM | POA: Diagnosis not present

## 2019-07-14 DIAGNOSIS — O0992 Supervision of high risk pregnancy, unspecified, second trimester: Secondary | ICD-10-CM

## 2019-07-14 DIAGNOSIS — O99213 Obesity complicating pregnancy, third trimester: Secondary | ICD-10-CM

## 2019-07-14 DIAGNOSIS — O26899 Other specified pregnancy related conditions, unspecified trimester: Secondary | ICD-10-CM

## 2019-07-14 DIAGNOSIS — O99212 Obesity complicating pregnancy, second trimester: Secondary | ICD-10-CM

## 2019-07-14 DIAGNOSIS — O0993 Supervision of high risk pregnancy, unspecified, third trimester: Secondary | ICD-10-CM

## 2019-07-14 DIAGNOSIS — Z3A28 28 weeks gestation of pregnancy: Secondary | ICD-10-CM

## 2019-07-14 DIAGNOSIS — Z113 Encounter for screening for infections with a predominantly sexual mode of transmission: Secondary | ICD-10-CM | POA: Diagnosis not present

## 2019-07-14 DIAGNOSIS — O34219 Maternal care for unspecified type scar from previous cesarean delivery: Secondary | ICD-10-CM

## 2019-07-14 DIAGNOSIS — O09813 Supervision of pregnancy resulting from assisted reproductive technology, third trimester: Secondary | ICD-10-CM

## 2019-07-14 DIAGNOSIS — Z131 Encounter for screening for diabetes mellitus: Secondary | ICD-10-CM

## 2019-07-14 MED ORDER — RHO D IMMUNE GLOBULIN 1500 UNIT/2ML IJ SOSY
300.0000 ug | PREFILLED_SYRINGE | Freq: Once | INTRAMUSCULAR | Status: AC
  Administered 2019-07-14: 300 ug via INTRAMUSCULAR

## 2019-07-14 NOTE — Addendum Note (Signed)
Addended by: Liliane Shi on: 07/14/2019 10:26 AM   Modules accepted: Orders

## 2019-07-14 NOTE — Progress Notes (Signed)
Routine Prenatal Care Visit  Subjective  Tammy Chase is a 34 y.o. G2P1001 at [redacted]w[redacted]d being seen today for ongoing prenatal care.  She is currently monitored for the following issues for this high-risk pregnancy and has Pregnancy; Infertility, anovulation; Irregular menstrual bleeding; Supervision of high risk pregnancy, antepartum; Pregnancy conceived through in vitro fertilization; Obesity affecting pregnancy; BMI 40.0-44.9, adult ([facility]); History of cesarean delivery affecting pregnancy; Rh negative state in antepartum period; and Nausea and vomiting during pregnancy on their problem list.  ----------------------------------------------------------------------------------- Patient reports feeling lightheaded and dizzy, seeing stars periodically throughout the day for the past three weeks. Has not lost consciousness.  She is unsure about specific inciting factors.     Contractions: Not present. Vag. Bleeding: None.  Movement: Present. Leaking Fluid denies.  Growth u/s is normal today. ----------------------------------------------------------------------------------- The following portions of the patient's history were reviewed and updated as appropriate: allergies, current medications, past family history, past medical history, past social history, past surgical history and problem list. Problem list updated.  Objective  Blood pressure 124/80, weight 227 lb (103 kg), last menstrual period 01/03/2019. Pregravid weight 225 lb (102.1 kg) Total Weight Gain 2 lb (0.907 kg) Urinalysis: Urine Protein    Urine Glucose    Fetal Status: Fetal Heart Rate (bpm): 133 ([place])   Movement: Present  Presentation: Vertex  General:  Alert, oriented and cooperative. Patient is in no acute distress.  Skin: Skin is warm and dry. No rash noted.   Cardiovascular: Normal heart rate noted  Respiratory: Normal respiratory effort, no problems with respiration noted  Abdomen: Soft, gravid, appropriate for gestational age.  Pain/Pressure: Absent     Pelvic:  Cervical exam deferred        Extremities: Normal range of motion.     Mental Status: Normal mood and affect. Normal behavior. Normal judgment and thought content.   Imaging Results US OB Follow Up  Result Date: 07/14/2019 Patient Name: Kalyssa Anker DOB: 1985/03/20 MRN: [id number] ULTRASOUND REPORT Location: [facility] Date of Service: 07/14/2019 Indications:growth/afi Findings: [place] Jim intrauterine pregnancy is visualized with FHR at 133 BPM. Biometrics give an (U/S) Gestational age of [redacted]w[redacted]d and an (U/S) EDD of 10/01/2019; this correlates with the clinically established Estimated Date of Delivery: 10/01/2019. Fetal presentation is Cephalic. Placenta: anterior. Grade: 2 AFI: 11.4 cm Growth percentile is 47.3%.  AC percentile is 25.3%. EFW: 1,230 g  (2 lb 11 oz) Impression: 1. [redacted]w[redacted]d Viable [place] Intrauterine pregnancy previously established criteria. 2. Growth is 47.3 %ile.  AFI is 11.4 cm. Deanna Artis, RT The ultrasound images and findings were reviewed by me and I agree with the above report. Thomasene Mohair, MD, [facility], [facility] 07/14/2019 9:52 AM       Assessment   34 y.o. G2P1001 at [redacted]w[redacted]d by  10/01/2019, by Other Basis presenting for routine prenatal visit  Plan   Pregnancy#2 Problems (from 01/03/19 to present)    Problem Noted Resolved   Rh negative state in antepartum period 03/12/2019 by Conard Novak, MD No   Overview Signed 03/12/2019  9:23 AM by Conard Novak, MD    [ ]  Rhogam per protocol      Nausea and vomiting during pregnancy 03/12/2019 by 05/10/2019, MD No   Supervision of high risk pregnancy, antepartum 02/27/2019 by 03/01/2019, MD No   Overview Addendum 03/20/2019  8:23 AM by 05/18/2019, MD    Clinic [facility] Prenatal Labs  Dating Day 5 embryo xfer, 7wk Conard Novak  Blood type: A/Negative/-- (01/28 0916)   Genetic Screen AFP:          NIPS: diploid XX Antibody:Negative (01/28  0916)  Anatomic [place]  Rubella: 3.52 (01/28 0916)  Varicella: Immune  GTT Early: 115Third trimester: []  RPR: Non Reactive (01/28 0916)   Rhogam [ ]  28 weeks HBsAg: Negative (01/28 0916)   TDaP vaccine                       Flu Shot: HIV: Non Reactive (01/28 0916)   Baby Food                                GBS:   Contraception  Pap:  CBB     CS/VBAC [ ]  ? TOLAC vs RCS   Support Person            Pregnancy conceived through in vitro fertilization 02/27/2019 by Will Bonnet, MD No   Overview Signed 02/27/2019 11:47 AM by Will Bonnet, MD    []  fetal echo at 22 weeks - declined PGS - day 5 embryo transfer - on estrogen patches and vaginal progesterone until 10 weeks per [facility].      Obesity affecting pregnancy 02/27/2019 by Will Bonnet, MD No   Overview Addendum 03/12/2019  9:23 AM by Will Bonnet, MD    [x]  early 1h gtt - 115      BMI 40.0-44.9, adult ([place]) 02/27/2019 by Will Bonnet, MD No   History of cesarean delivery affecting pregnancy 02/27/2019 by Will Bonnet, MD No   Overview Signed 02/27/2019 11:48 AM by Will Bonnet, MD    [ ]  decide of TOLAC          Preterm labor symptoms and general obstetric precautions including but not limited to vaginal bleeding, contractions, leaking of fluid and fetal movement were reviewed in detail with the patient. Please refer to After Visit Summary for other counseling recommendations.   - 28 week labs today - rhogam today - discussed feeling lightheaded. Discussed hydration, caval compression, etc.  Continue to monitor. - she has not had a fetal echo. Discussed rationale behind obtaining this study. She states that she will schedule.   Return in about 2 weeks (around 07/28/2019) for Routine Prenatal Appointment.  Prentice Docker, MD, [facility], [facility] 07/14/2019 10:19 AM

## 2019-07-15 ENCOUNTER — Other Ambulatory Visit: Payer: Self-pay | Admitting: Obstetrics and Gynecology

## 2019-07-15 ENCOUNTER — Telehealth: Payer: Self-pay

## 2019-07-15 DIAGNOSIS — O99810 Abnormal glucose complicating pregnancy: Secondary | ICD-10-CM

## 2019-07-15 DIAGNOSIS — O0993 Supervision of high risk pregnancy, unspecified, third trimester: Secondary | ICD-10-CM

## 2019-07-15 LAB — 28 WEEKS RH-PANEL
Antibody Screen: NEGATIVE
Basophils Absolute: 0 10*3/uL (ref 0.0–0.2)
Basos: 0 %
EOS (ABSOLUTE): 0.1 10*3/uL (ref 0.0–0.4)
Eos: 1 %
Gestational Diabetes Screen: 152 mg/dL — ABNORMAL HIGH (ref 65–139)
HIV Screen 4th Generation wRfx: NONREACTIVE
Hematocrit: 34.1 % (ref 34.0–46.6)
Hemoglobin: 11.5 g/dL (ref 11.1–15.9)
Immature Grans (Abs): 0.1 10*3/uL (ref 0.0–0.1)
Immature Granulocytes: 1 %
Lymphocytes Absolute: 1.6 10*3/uL (ref 0.7–3.1)
Lymphs: 17 %
MCH: 28.6 pg (ref 26.6–33.0)
MCHC: 33.7 g/dL (ref 31.5–35.7)
MCV: 85 fL (ref 79–97)
Monocytes Absolute: 0.3 10*3/uL (ref 0.1–0.9)
Monocytes: 3 %
Neutrophils Absolute: 7.8 10*3/uL — ABNORMAL HIGH (ref 1.4–7.0)
Neutrophils: 78 %
Platelets: 155 10*3/uL (ref 150–450)
RBC: 4.02 x10E6/uL (ref 3.77–5.28)
RDW: 16.1 % — ABNORMAL HIGH (ref 11.7–15.4)
RPR Ser Ql: NONREACTIVE
WBC: 9.9 10*3/uL (ref 3.4–10.8)

## 2019-07-15 NOTE — Telephone Encounter (Signed)
-----   Message from Conard Novak, MD sent at 07/15/2019  8:11 AM EDT ----- Would you mind letting Shruti know that she did not pass her 1 hour gtt and needs a 3 hour? I'll put in the orders. Thx!

## 2019-07-15 NOTE — Telephone Encounter (Signed)
Patient is schedule for 07/22/19

## 2019-07-15 NOTE — Telephone Encounter (Signed)
I tried to call pt.. can you please help get this scheduled when she calls back? Or maybe try her again in a bit. Thank you. Please let me know once scheduled

## 2019-07-15 NOTE — Progress Notes (Signed)
Would you mind letting Tammy Chase know that she did not pass her 1 hour gtt and needs a 3 hour? I'll put in the orders. Thx!

## 2019-07-15 NOTE — Telephone Encounter (Signed)
Called and left voicemail for patient to call back to be scheduled. 

## 2019-07-22 ENCOUNTER — Other Ambulatory Visit: Payer: BC Managed Care – PPO

## 2019-07-30 ENCOUNTER — Encounter: Payer: BC Managed Care – PPO | Admitting: Obstetrics and Gynecology

## 2019-07-31 ENCOUNTER — Ambulatory Visit (INDEPENDENT_AMBULATORY_CARE_PROVIDER_SITE_OTHER): Payer: BC Managed Care – PPO | Admitting: Advanced Practice Midwife

## 2019-07-31 ENCOUNTER — Other Ambulatory Visit: Payer: Self-pay

## 2019-07-31 ENCOUNTER — Encounter: Payer: Self-pay | Admitting: Advanced Practice Midwife

## 2019-07-31 VITALS — BP 126/76 | Wt 226.0 lb

## 2019-07-31 DIAGNOSIS — O0993 Supervision of high risk pregnancy, unspecified, third trimester: Secondary | ICD-10-CM

## 2019-07-31 DIAGNOSIS — O99810 Abnormal glucose complicating pregnancy: Secondary | ICD-10-CM

## 2019-07-31 DIAGNOSIS — Z3A31 31 weeks gestation of pregnancy: Secondary | ICD-10-CM

## 2019-07-31 DIAGNOSIS — Z23 Encounter for immunization: Secondary | ICD-10-CM

## 2019-07-31 NOTE — Progress Notes (Signed)
ROB 3hr GTT today

## 2019-07-31 NOTE — Progress Notes (Signed)
Routine Prenatal Care Visit  Subjective  Tammy Chase is a 34 y.o. G2P1001 at [redacted]w[redacted]d being seen today for ongoing prenatal care.  She is currently monitored for the following issues for this high-risk pregnancy and has Pregnancy; Infertility, anovulation; Irregular menstrual bleeding; Supervision of high risk pregnancy, antepartum; Pregnancy conceived through in vitro fertilization; Obesity affecting pregnancy; BMI 40.0-44.9, adult ([facility]); History of cesarean delivery affecting pregnancy; Rh negative state in antepartum period; and Nausea and vomiting during pregnancy on their problem list.  ----------------------------------------------------------------------------------- Patient reports a stressful day at work yesterday and had some cramping/abdominal tightening. She admits not drinking much water all day.  She has been trying to reschedule fetal echo that was scheduled for 06/13/19 and unable to reach anyone to schedule. She will keep trying to reach them and I will mention to our referral staff for suggestions.  Contractions: Irregular. Vag. Bleeding: None.  Movement: Present. Leaking Fluid denies.  ----------------------------------------------------------------------------------- The following portions of the patient's history were reviewed and updated as appropriate: allergies, current medications, past family history, past medical history, past social history, past surgical history and problem list. Problem list updated.  Objective  Blood pressure 126/76, weight 226 lb (102.5 kg), last menstrual period 01/03/2019. Pregravid weight 225 lb (102.1 kg) Total Weight Gain 1 lb (0.454 kg) Urinalysis: Urine Protein    Urine Glucose    Fetal Status: Fetal Heart Rate (bpm): 148 Fundal Height: 31 cm Movement: Present     General:  Alert, oriented and cooperative. Patient is in no acute distress.  Skin: Skin is warm and dry. No rash noted.   Cardiovascular: Normal heart rate noted  Respiratory:  Normal respiratory effort, no problems with respiration noted  Abdomen: Soft, gravid, appropriate for gestational age. Pain/Pressure: Absent     Pelvic:  Cervical exam deferred        Extremities: Normal range of motion.     Mental Status: Normal mood and affect. Normal behavior. Normal judgment and thought content.   Assessment   34 y.o. G2P1001 at [redacted]w[redacted]d by  10/01/2019, by Other Basis presenting for routine prenatal visit  Plan   Pregnancy#2 Problems (from 01/03/19 to present)    Problem Noted Resolved   Rh negative state in antepartum period 03/12/2019 by Conard Novak, MD No   Overview Signed 03/12/2019  9:23 AM by Conard Novak, MD    [ ]  Rhogam per protocol      Nausea and vomiting during pregnancy 03/12/2019 by 05/10/2019, MD No   Supervision of high risk pregnancy, antepartum 02/27/2019 by 03/01/2019, MD No   Overview Addendum 03/20/2019  8:23 AM by 05/18/2019, MD    Clinic [facility] Prenatal Labs  Dating Day 5 embryo xfer, 7wk Conard Novak Blood type: A/Negative/-- (01/28 0916)   Genetic Screen AFP:          NIPS: diploid XX Antibody:Negative (01/28 0916)  Anatomic 03-09-1985  Rubella: 3.52 (01/28 0916)  Varicella: Immune  GTT Early: 115Third trimester: []  RPR: Non Reactive (01/28 0916)   Rhogam [ ]  28 weeks HBsAg: Negative (01/28 0916)   TDaP vaccine                       Flu Shot: HIV: Non Reactive (01/28 0916)   Baby Food                                GBS:  Contraception  Pap:  CBB     CS/VBAC [ ]  ? TOLAC vs RCS   Support Person            Previous Version   Pregnancy conceived through in vitro fertilization 02/27/2019 by Will Bonnet, MD No   Overview Signed 02/27/2019 11:47 AM by Will Bonnet, MD    []  fetal echo at 22 weeks - declined PGS - day 5 embryo transfer - on estrogen patches and vaginal progesterone until 10 weeks per [facility].      Obesity affecting pregnancy 02/27/2019 by Will Bonnet, MD No   Overview Addendum 03/12/2019   9:23 AM by Will Bonnet, MD    [x]  early 1h gtt - 115      Previous Version   BMI 40.0-44.9, adult ([place]) 02/27/2019 by Will Bonnet, MD No   History of cesarean delivery affecting pregnancy 02/27/2019 by Will Bonnet, MD No   Overview Signed 02/27/2019 11:48 AM by Will Bonnet, MD    [ ]  decide of TOLAC       3 hr gtt today    Preterm labor symptoms and general obstetric precautions including but not limited to vaginal bleeding, contractions, leaking of fluid and fetal movement were reviewed in detail with the patient. Please refer to After Visit Summary for other counseling recommendations.   Return in about 2 weeks (around 08/14/2019) for growth scan and rob.  Rod Can, CNM 07/31/2019 8:42 AM

## 2019-07-31 NOTE — Patient Instructions (Signed)
Gestational Diabetes Mellitus, Self Care  Caring for yourself after you have been diagnosed with gestational diabetes (gestational diabetes mellitus) means keeping your blood sugar (glucose) under control. You can do that with a balance of:   Nutrition.   Exercise.   Lifestyle changes.   Medicines or insulin, if necessary.   Support from your team of health care providers and others.  The following information explains what you need to know to manage your gestational diabetes at home.  What are the risks?  If gestational diabetes is treated, it is unlikely to cause problems. If it is not controlled with treatment, it may cause problems during labor and delivery, and some of those problems can be harmful to the unborn baby (fetus) and the mother. Uncontrolled gestational diabetes may also cause the newborn baby to have breathing problems and low blood glucose.  Women who get gestational diabetes are more likely to develop it if they get pregnant again, and they are more likely to develop type 2 diabetes in the future.  How to monitor blood glucose     Check your blood glucose every day during your pregnancy. Do this as often as told by your health care provider.   Contact your health care provider if your blood glucose is above your target for two tests in a row.  Your health care provider will set individualized treatment goals for you. Generally, the goal of treatment is to maintain the following blood glucose levels during pregnancy:   Before meals (preprandial): at or below 95 mg/dL (5.3 mmol/L).   After meals (postprandial):  ? One hour after a meal: at or below 140 mg/dL (7.8 mmol/L).  ? Two hours after a meal: at or below 120 mg/dL (6.7 mmol/L).   A1c (hemoglobin A1c) level: 6-6.5%.  How to manage hyperglycemia and hypoglycemia  Hyperglycemia symptoms  Hyperglycemia, also called high blood glucose, occurs when blood glucose is too high. Make sure you know the early signs of hyperglycemia, such  as:   Increased thirst.   Hunger.   Feeling very tired.   Needing to urinate more often than usual.   Blurry vision.  Hypoglycemia symptoms  Hypoglycemia, also called low blood glucose, occurs with a blood glucose level at or below 70 mg/dL (3.9 mmol/L). The risk for hypoglycemia increases during or after exercise, during sleep, during illness, and when skipping meals or not eating for a long time (fasting). Symptoms may include:   Hunger.   Anxiety.   Sweating and feeling clammy.   Confusion.   Dizziness or feeling light-headed.   Sleepiness.   Nausea.   Increased heart rate.   Headache.   Blurry vision.   Irritability.   Tingling or numbness around the mouth, lips, or tongue.   A change in coordination.   Restless sleep.   Fainting.   Seizure.  It is important to know the symptoms of hypoglycemia and treat it right away. Always have a 15-gram rapid-acting carbohydrate snack with you to treat low blood glucose. Family members and close friends should also know the symptoms and should understand how to treat hypoglycemia, in case you are not able to treat yourself.  Treating hypoglycemia  If you are alert and able to swallow safely, follow the 15:15 rule:   Take 15 grams of a rapid-acting carbohydrate. Talk with your health care provider about how much you should take.   Rapid-acting options include:  ? Glucose pills (take 15 grams).  ? 6-8 pieces of hard candy.  ?   mmol/L), take 15 grams of a carbohydrate again.  If your blood glucose level does not increase above 70 mg/dL (3.9 mmol/L) after 3 tries, seek emergency medical care.  After your blood glucose level returns to normal, eat a meal or a snack within 1 hour. Treating  severe hypoglycemia Severe hypoglycemia is when your blood glucose level is at or below 54 mg/dL (3 mmol/L). Severe hypoglycemia is an emergency. Do not wait to see if the symptoms will go away. Get medical help right away. Call your local emergency services (911 in the [place] If you have severe hypoglycemia and you cannot eat or drink, you may need an injection of glucagon. A family member or close friend should learn how to check your blood glucose and how to give you a glucagon injection. Ask your health care provider if you need to have an emergency glucagon injection kit available. Severe hypoglycemia may need to be treated in a hospital. The treatment may include getting glucose through an IV. You may also need treatment for the cause of your hypoglycemia. Follow these instructions at home: Take diabetes medicines as told  If your health care provider prescribed insulin or diabetes medicines, take them every day.  Do not run out of insulin or other diabetes medicines that you take. Plan ahead so you always have these available.  If you use insulin, adjust your dosage based on how physically active you are and what foods you eat. Your health care provider will tell you how to adjust your dosage. Make healthy food choices  The things that you eat and drink affect your blood glucose (and your insulin dose, if this applies). Making good choices helps to control your diabetes and prevent other health problems. A healthy meal plan includes eating lean proteins, complex carbohydrates, fresh fruits and vegetables, low-fat dairy products, and healthy fats. Make an appointment to see a diet and nutrition specialist (registered dietitian) to help you create an eating plan that is right for you. Make sure that you:  Follow instructions from your health care provider about eating or drinking restrictions.  Drink enough fluid to keep your urine pale yellow.  Eat healthy snacks between nutritious  meals.  Keep a record of the carbohydrates that you eat. Do this by reading food labels and learning the standard serving sizes of foods.  Follow your sick day plan whenever you cannot eat or drink as usual. Make this plan in advance with your health care provider.  Stay active  Do 30 or more minutes of physical activity a day, or as much physical activity as your health care provider recommends during your pregnancy. ? Doing 10 minutes of exercise starting 30 minutes after each meal may help to control postprandial blood glucose levels.  If you start a new exercise or activity, work with your health care provider to adjust your insulin, medicines, or food intake as needed. Make healthy lifestyle choices  Do not drink alcohol.  Do not use any tobacco products, such as cigarettes, chewing tobacco, and e-cigarettes. If you need help quitting, ask your health care provider.  Learn to manage stress. If you need help with this, ask your health care provider. Care for your body  Keep your immunizations up to date.  Brush your teeth and gums two times a day, and floss one or more times a day. Visit your dentist one or more times every 6 months.  Maintain a healthy weight during your pregnancy. General instructions  Take over-the-counter and  prescription medicines only as told by your health care provider.  Talk with your health care provider about your risk for high blood pressure during pregnancy (preeclampsia or eclampsia).  Share your diabetes management plan with people in your workplace, school, and household.  Check your urine for ketones during your pregnancy when you are ill and as told by your health care provider.  Carry a medical alert card or wear medical alert jewelry that says you have gestational diabetes.  Keep all follow-up visits during your pregnancy (prenatal) and after delivery (postnatal) as told by your health care provider. This is important. Get the care that  you need after delivery  Have your blood glucose level checked 4-12 weeks after delivery. This is done with an oral glucose tolerance test (OGTT).  Get screened for diabetes at least every 3 years, or as often as told by your health care provider. Questions to ask your health care provider  Do I need to meet with a diabetes educator?  Where can I find a support group for people with gestational diabetes? Where to find more information For more information about gestational diabetes, visit:  American Diabetes Association (ADA): www.diabetes.org  Centers for Disease Control and Prevention (CDC): [employer] Summary  Check your blood glucose every day during your pregnancy. Do this as often as told by your health care provider.  If your health care provider prescribed insulin or diabetes medicines, take them every day as told.  Keep all follow-up visits during your pregnancy (prenatal) and after delivery (postnatal) as told by your health care provider. This is important.  Have your blood glucose level checked 4-12 weeks after delivery. This information is not intended to replace advice given to you by your health care provider. Make sure you discuss any questions you have with your health care provider. Document Revised: 07/16/2017 Document Reviewed: 02/26/2015 Elsevier Patient Education  2020 [employer]. Third Trimester of Pregnancy The third trimester is from week 28 through week 40 (months 7 through 9). The third trimester is a time when the unborn baby (fetus) is growing rapidly. At the end of the ninth month, the fetus is about 20 inches in length and weighs 6-10 pounds. Body changes during your third trimester Your body will continue to go through many changes during pregnancy. The changes vary from woman to woman. During the third trimester:  Your weight will continue to increase. You can expect to gain 25-35 pounds (11-16 kg) by the end of the pregnancy.  You may begin to  get stretch marks on your hips, abdomen, and breasts.  You may urinate more often because the fetus is moving lower into your pelvis and pressing on your bladder.  You may develop or continue to have heartburn. This is caused by increased hormones that slow down muscles in the digestive tract.  You may develop or continue to have constipation because increased hormones slow digestion and cause the muscles that push waste through your intestines to relax.  You may develop hemorrhoids. These are swollen veins (varicose veins) in the rectum that can itch or be painful.  You may develop swollen, bulging veins (varicose veins) in your legs.  You may have increased body aches in the pelvis, back, or thighs. This is due to weight gain and increased hormones that are relaxing your joints.  You may have changes in your hair. These can include thickening of your hair, rapid growth, and changes in texture. Some women also have hair loss during or after  pregnancy, or hair that feels dry or thin. Your hair will most likely return to normal after your baby is born.  Your breasts will continue to grow and they will continue to become tender. A yellow fluid (colostrum) may leak from your breasts. This is the first milk you are producing for your baby.  Your belly button may stick out.  You may notice more swelling in your hands, face, or ankles.  You may have increased tingling or numbness in your hands, arms, and legs. The skin on your belly may also feel numb.  You may feel short of breath because of your expanding uterus.  You may have more problems sleeping. This can be caused by the size of your belly, increased need to urinate, and an increase in your body's metabolism.  You may notice the fetus "dropping," or moving lower in your abdomen (lightening).  You may have increased vaginal discharge.  You may notice your joints feel loose and you may have pain around your pelvic bone. What to expect  at prenatal visits You will have prenatal exams every 2 weeks until week 36. Then you will have weekly prenatal exams. During a routine prenatal visit:  You will be weighed to make sure you and the baby are growing normally.  Your blood pressure will be taken.  Your abdomen will be measured to track your baby's growth.  The fetal heartbeat will be listened to.  Any test results from the previous visit will be discussed.  You may have a cervical check near your due date to see if your cervix has softened or thinned (effaced).  You will be tested for Group B streptococcus. This happens between 35 and 37 weeks. Your health care provider may ask you:  What your birth plan is.  How you are feeling.  If you are feeling the baby move.  If you have had any abnormal symptoms, such as leaking fluid, bleeding, severe headaches, or abdominal cramping.  If you are using any tobacco products, including cigarettes, chewing tobacco, and electronic cigarettes.  If you have any questions. Other tests or screenings that may be performed during your third trimester include:  Blood tests that check for low iron levels (anemia).  Fetal testing to check the health, activity level, and growth of the fetus. Testing is done if you have certain medical conditions or if there are problems during the pregnancy.  Nonstress test (NST). This test checks the health of your baby to make sure there are no signs of problems, such as the baby not getting enough oxygen. During this test, a belt is placed around your belly. The baby is made to move, and its heart rate is monitored during movement. What is false labor? False labor is a condition in which you feel small, irregular tightenings of the muscles in the womb (contractions) that usually go away with rest, changing position, or drinking water. These are called Braxton Hicks contractions. Contractions may last for hours, days, or even weeks before true labor  sets in. If contractions come at regular intervals, become more frequent, increase in intensity, or become painful, you should see your health care provider. What are the signs of labor?  Abdominal cramps.  Regular contractions that start at 10 minutes apart and become stronger and more frequent with time.  Contractions that start on the top of the uterus and spread down to the lower abdomen and back.  Increased pelvic pressure and dull back pain.  A watery or  bloody mucus discharge that comes from the vagina.  Leaking of amniotic fluid. This is also known as your "water breaking." It could be a slow trickle or a gush. Let your health care provider know if it has a color or strange odor. If you have any of these signs, call your health care provider right away, even if it is before your due date. Follow these instructions at home: Medicines  Follow your health care provider's instructions regarding medicine use. Specific medicines may be either safe or unsafe to take during pregnancy.  Take a prenatal vitamin that contains at least 600 micrograms (mcg) of folic acid.  If you develop constipation, try taking a stool softener if your health care provider approves. Eating and drinking   Eat a balanced diet that includes fresh fruits and vegetables, whole grains, good sources of protein such as meat, eggs, or tofu, and low-fat dairy. Your health care provider will help you determine the amount of weight gain that is right for you.  Avoid raw meat and uncooked cheese. These carry germs that can cause birth defects in the baby.  If you have low calcium intake from food, talk to your health care provider about whether you should take a daily calcium supplement.  Eat four or five small meals rather than three large meals a day.  Limit foods that are high in fat and processed sugars, such as fried and sweet foods.  To prevent constipation: ? Drink enough fluid to keep your urine clear or  pale yellow. ? Eat foods that are high in fiber, such as fresh fruits and vegetables, whole grains, and beans. Activity  Exercise only as directed by your health care provider. Most women can continue their usual exercise routine during pregnancy. Try to exercise for 30 minutes at least 5 days a week. Stop exercising if you experience uterine contractions.  Avoid heavy lifting.  Do not exercise in extreme heat or humidity, or at high altitudes.  Wear low-heel, comfortable shoes.  Practice good posture.  You may continue to have sex unless your health care provider tells you otherwise. Relieving pain and discomfort  Take frequent breaks and rest with your legs elevated if you have leg cramps or low back pain.  Take warm sitz baths to soothe any pain or discomfort caused by hemorrhoids. Use hemorrhoid cream if your health care provider approves.  Wear a good support bra to prevent discomfort from breast tenderness.  If you develop varicose veins: ? Wear support pantyhose or compression stockings as told by your healthcare provider. ? Elevate your feet for 15 minutes, 3-4 times a day. Prenatal care  Write down your questions. Take them to your prenatal visits.  Keep all your prenatal visits as told by your health care provider. This is important. Safety  Wear your seat belt at all times when driving.  Make a list of emergency phone numbers, including numbers for family, friends, the hospital, and police and fire departments. General instructions  Avoid cat litter boxes and soil used by cats. These carry germs that can cause birth defects in the baby. If you have a cat, ask someone to clean the litter box for you.  Do not travel far distances unless it is absolutely necessary and only with the approval of your health care provider.  Do not use hot tubs, steam rooms, or saunas.  Do not drink alcohol.  Do not use any products that contain nicotine or tobacco, such as  cigarettes and e-cigarettes.  If you need help quitting, ask your health care provider.  Do not use any medicinal herbs or unprescribed drugs. These chemicals affect the formation and growth of the baby.  Do not douche or use tampons or scented sanitary pads.  Do not cross your legs for long periods of time.  To prepare for the arrival of your baby: ? Take prenatal classes to understand, practice, and ask questions about labor and delivery. ? Make a trial run to the hospital. ? Visit the hospital and tour the maternity area. ? Arrange for maternity or paternity leave through employers. ? Arrange for family and friends to take care of pets while you are in the hospital. ? Purchase a rear-facing car seat and make sure you know how to install it in your car. ? Pack your hospital bag. ? Prepare the baby's nursery. Make sure to remove all pillows and stuffed animals from the baby's crib to prevent suffocation.  Visit your dentist if you have not gone during your pregnancy. Use a soft toothbrush to brush your teeth and be gentle when you floss. Contact a health care provider if:  You are unsure if you are in labor or if your water has broken.  You become dizzy.  You have mild pelvic cramps, pelvic pressure, or nagging pain in your abdominal area.  You have lower back pain.  You have persistent nausea, vomiting, or diarrhea.  You have an unusual or bad smelling vaginal discharge.  You have pain when you urinate. Get help right away if:  Your water breaks before 37 weeks.  You have regular contractions less than 5 minutes apart before 37 weeks.  You have a fever.  You are leaking fluid from your vagina.  You have spotting or bleeding from your vagina.  You have severe abdominal pain or cramping.  You have rapid weight loss or weight gain.  You have shortness of breath with chest pain.  You notice sudden or extreme swelling of your face, hands, ankles, feet, or legs.  Your  baby makes fewer than 10 movements in 2 hours.  You have severe headaches that do not go away when you take medicine.  You have vision changes. Summary  The third trimester is from week 28 through week 40, months 7 through 9. The third trimester is a time when the unborn baby (fetus) is growing rapidly.  During the third trimester, your discomfort may increase as you and your baby continue to gain weight. You may have abdominal, leg, and back pain, sleeping problems, and an increased need to urinate.  During the third trimester your breasts will keep growing and they will continue to become tender. A yellow fluid (colostrum) may leak from your breasts. This is the first milk you are producing for your baby.  False labor is a condition in which you feel small, irregular tightenings of the muscles in the womb (contractions) that eventually go away. These are called Braxton Hicks contractions. Contractions may last for hours, days, or even weeks before true labor sets in.  Signs of labor can include: abdominal cramps; regular contractions that start at 10 minutes apart and become stronger and more frequent with time; watery or bloody mucus discharge that comes from the vagina; increased pelvic pressure and dull back pain; and leaking of amniotic fluid. This information is not intended to replace advice given to you by your health care provider. Make sure you discuss any questions you have with your health care provider. Document Revised:  05/16/2018 Document Reviewed: 02/29/2016 Elsevier Patient Education  [place].

## 2019-08-01 LAB — GESTATIONAL GLUCOSE TOLERANCE
Glucose, Fasting: 75 mg/dL (ref 65–94)
Glucose, GTT - 1 Hour: 172 mg/dL (ref 65–179)
Glucose, GTT - 2 Hour: 90 mg/dL (ref 65–154)
Glucose, GTT - 3 Hour: 103 mg/dL (ref 65–139)

## 2019-08-08 DIAGNOSIS — O09813 Supervision of pregnancy resulting from assisted reproductive technology, third trimester: Secondary | ICD-10-CM | POA: Diagnosis not present

## 2019-08-08 DIAGNOSIS — O359XX0 Maternal care for (suspected) fetal abnormality and damage, unspecified, not applicable or unspecified: Secondary | ICD-10-CM | POA: Diagnosis not present

## 2019-08-08 DIAGNOSIS — Z3A32 32 weeks gestation of pregnancy: Secondary | ICD-10-CM | POA: Diagnosis not present

## 2019-08-08 DIAGNOSIS — Z3183 Encounter for assisted reproductive fertility procedure cycle: Secondary | ICD-10-CM | POA: Diagnosis not present

## 2019-08-19 ENCOUNTER — Encounter: Payer: Self-pay | Admitting: Obstetrics and Gynecology

## 2019-08-19 ENCOUNTER — Ambulatory Visit (INDEPENDENT_AMBULATORY_CARE_PROVIDER_SITE_OTHER): Payer: BC Managed Care – PPO | Admitting: Obstetrics and Gynecology

## 2019-08-19 ENCOUNTER — Ambulatory Visit (INDEPENDENT_AMBULATORY_CARE_PROVIDER_SITE_OTHER): Payer: BC Managed Care – PPO

## 2019-08-19 ENCOUNTER — Other Ambulatory Visit: Payer: Self-pay

## 2019-08-19 VITALS — BP 116/70 | Wt 229.6 lb

## 2019-08-19 DIAGNOSIS — Z3A34 34 weeks gestation of pregnancy: Secondary | ICD-10-CM | POA: Diagnosis not present

## 2019-08-19 DIAGNOSIS — Z3A33 33 weeks gestation of pregnancy: Secondary | ICD-10-CM

## 2019-08-19 DIAGNOSIS — O0990 Supervision of high risk pregnancy, unspecified, unspecified trimester: Secondary | ICD-10-CM

## 2019-08-19 DIAGNOSIS — O0993 Supervision of high risk pregnancy, unspecified, third trimester: Secondary | ICD-10-CM | POA: Diagnosis not present

## 2019-08-19 NOTE — Progress Notes (Signed)
Routine Prenatal Care Visit  Subjective  Tammy Chase is a 34 y.o. G2P1001 at [redacted]w[redacted]d being seen today for ongoing prenatal care.  She is currently monitored for the following issues for this high-risk pregnancy and has Pregnancy; Infertility, anovulation; Irregular menstrual bleeding; Supervision of high risk pregnancy, antepartum; Pregnancy conceived through in vitro fertilization; Obesity affecting pregnancy; BMI 40.0-44.9, adult (HCC); History of cesarean delivery affecting pregnancy; Rh negative state in antepartum period; and Nausea and vomiting during pregnancy on their problem list.  ----------------------------------------------------------------------------------- Patient reports no complaints.    . Vag. Bleeding: None.  Movement: Present. Denies leaking of fluid.  ----------------------------------------------------------------------------------- The following portions of the patient's history were reviewed and updated as appropriate: allergies, current medications, past family history, past medical history, past social history, past surgical history and problem list. Problem list updated.   Objective  Blood pressure 116/70, weight 229 lb 9.6 oz (104.1 kg), last menstrual period 01/03/2019. Pregravid weight 225 lb (102.1 kg) Total Weight Gain 4 lb 9.6 oz (2.087 kg) Urinalysis:      Fetal Status:     Movement: Present     General:  Alert, oriented and cooperative. Patient is in no acute distress.  Skin: Skin is warm and dry. No rash noted.   Cardiovascular: Normal heart rate noted  Respiratory: Normal respiratory effort, no problems with respiration noted  Abdomen: Soft, gravid, appropriate for gestational age. Pain/Pressure: Absent     Pelvic:  Cervical exam deferred        Extremities: Normal range of motion.     Mental Status: Normal mood and affect. Normal behavior. Normal judgment and thought content.     Assessment   34 y.o. G2P1001 at [redacted]w[redacted]d by  10/01/2019, by  Other Basis presenting for routine prenatal visit  Plan   Pregnancy#2 Problems (from 01/03/19 to present)    Problem Noted Resolved   Rh negative state in antepartum period 03/12/2019 by Conard Novak, MD No   Overview Signed 03/12/2019  9:23 AM by Conard Novak, MD    [ ]  Rhogam per protocol      Nausea and vomiting during pregnancy 03/12/2019 by 05/10/2019, MD No   Supervision of high risk pregnancy, antepartum 02/27/2019 by 03/01/2019, MD No   Overview Addendum 08/19/2019  3:59 PM by 08/21/2019, MD    Clinic [facility] Prenatal Labs  Dating Day 5 embryo xfer, 7wk Natale Milch Blood type: A/Negative/-- (01/28 0916)   Genetic Screen AFP:          NIPS: diploid XX Antibody:Negative (01/28 0916)  Anatomic 03-09-1985 complete Rubella: 3.52 (01/28 0916)  Varicella: Immune  GTT Early: 115 Third trimester: 152 3hr WNL RPR: Non Reactive (01/28 0916)   Rhogam  07/14/2019 HBsAg: Negative (01/28 0916)   TDaP vaccine   07/31/2019  Flu Shot: HIV: Non Reactive (01/28 0916)   Baby Food    Breast                            GBS:   Contraception  Undecided Pap: 2021 NIL  CBB     CS/VBAC [ ]  Desires TOLAC    Support Person  Husband: 2022           Previous Version   Pregnancy conceived through in vitro fertilization 02/27/2019 by Feliz Beam, MD No   Overview Signed 02/27/2019 11:47 AM by Conard Novak, MD    []  fetal echo at  22 weeks - declined PGS - day 5 embryo transfer - on estrogen patches and vaginal progesterone until 10 weeks per [facility].      Obesity affecting pregnancy 02/27/2019 by Conard Novak, MD No   Overview Addendum 03/12/2019  9:23 AM by Conard Novak, MD    [x]  early 1h gtt - 115      Previous Version   BMI 40.0-44.9, adult (HCC) 02/27/2019 by 03/01/2019, MD No   History of cesarean delivery affecting pregnancy 02/27/2019 by 03/01/2019, MD No   Overview Signed 02/27/2019 11:48 AM by 03/01/2019, MD    [ ]  decide of  TOLAC          Discussed VBAC risks.  Individualized chance of vaginal birth after cesarean 38.9%  34 y.o. G2P1001 at [redacted]w[redacted]d with Estimated Date of Delivery: 10/01/19 was seen today in office to discuss trial of labor after cesarean section (TOLAC) versus elective repeat cesarean delivery (ERCD). The following risks were discussed with the patient.  Risk of uterine rupture at term is 0.78 percent with TOLAC and 0.22 percent with ERCD. 1 in 10 uterine ruptures will result in neonatal death or neurological injury. The benefits of a trial of labor after cesarean (TOLAC) resulting in a vaginal birth after cesarean (VBAC) include the following: shorter length of hospital stay and postpartum recovery (in most cases); fewer complications, such as postpartum fever, wound or uterine infection, thromboembolism (blood clots in the leg or lung), need for blood transfusion and fewer neonatal breathing problems. The risks of an attempted VBAC or TOLAC include the following: . Risk of failed trial of labor after cesarean (TOLAC) without a vaginal birth after cesarean (VBAC) resulting in repeat cesarean delivery (RCD) in about 20 to 40 percent of women who attempt VBAC.  Her individualized success rate using the MFM[facility] VBAC risk calculator is 38.9%.   . Risk of rupture of uterus resulting in an emergency cesarean delivery. The risk of uterine rupture may be related in part to the type of uterine incision made during the first cesarean delivery. A previous transverse uterine incision has the lowest risk of rupture (0.2 to 1.5 percent risk). Vertical or T-shaped uterine incisions have a higher risk of uterine rupture (4 to 9 percent risk)The risk of fetal death is very low with both VBAC and elective repeat cesarean delivery (ERCD), but the likelihood of fetal death is higher with VBAC than with ERCD. Maternal death is very rare with either type of delivery. The risks of an elective repeat cesarean delivery (ERCD) were  reviewed with the patient including but not limited to: 03/998 risk of uterine rupture which could have serious consequences, bleeding which may require transfusion; infection which may require antibiotics; injury to bowel, bladder or other surrounding organs (bowel, bladder, ureters); injury to the fetus; need for additional procedures including hysterectomy in the event of a life-threatening hemorrhage; thromboembolic phenomenon; abnormal placentation; incisional problems; death and other postoperative or anesthesia complications.    In addition we discussed that our collective office practice is to allow patient's who desire to attempt TOLAC to go into labor naturally.  There is some limited data that rupture rate may increase past [redacted] weeks gestation, but it is reasonable for women who are strongly committed to [facility] to continue pregnancy into the 41st week.  Medical indications necessetating early delivery may arise during the course of any pregnancy.  Given the contraindication on the use of prostaglandins for use in cervical  ripening,  recommendation would be to proceed with repeat cesarean for delivery for patient's with unfavorable cervix (low Bishops score) who reach 41 weeks or who otherwise have a medical indication for early delivery.   These risks and benefits are summarized on the consent form, which was reviewed with the patient during the visit.  All her questions answered and she signed a consent indicating a preference for TOLAC/ERCD. A copy of the consent was given to the patient.  Last cesarean was performed at 4 cm, Slow induction and was having fetal heart tone issues. Performed 5 years ago. She is unsure whey she was induced.  Gestational age appropriate obstetric precautions including but not limited to vaginal bleeding, contractions, leaking of fluid and fetal movement were reviewed in detail with the patient.    Return in about 2 weeks (around 09/02/2019) for rob in  person.  Natale Milch MD [facility], [facility] 08/19/2019, 4:00 PM

## 2019-08-19 NOTE — Patient Instructions (Addendum)
34 y.o. G2P1001 at [redacted]w[redacted]d with Estimated Date of Delivery: 10/01/19 was seen today in office to discuss trial of labor after cesarean section (TOLAC) versus elective repeat cesarean delivery (ERCD). The following risks were discussed with the patient.  Risk of uterine rupture at term is 0.78 percent with TOLAC and 0.22 percent with ERCD. 1 in 10 uterine ruptures will result in neonatal death or neurological injury. The benefits of a trial of labor after cesarean (TOLAC) resulting in a vaginal birth after cesarean (VBAC) include the following: shorter length of hospital stay and postpartum recovery (in most cases); fewer complications, such as postpartum fever, wound or uterine infection, thromboembolism (blood clots in the leg or lung), need for blood transfusion and fewer neonatal breathing problems. The risks of an attempted VBAC or TOLAC include the following:  Risk of failed trial of labor after cesarean (TOLAC) without a vaginal birth after cesarean (VBAC) resulting in repeat cesarean delivery (RCD) in about 20 to 40 percent of women who attempt VBAC.  Her individualized success rate using the [facility] VBAC risk calculator is 38.9%.    Risk of rupture of uterus resulting in an emergency cesarean delivery. The risk of uterine rupture may be related in part to the type of uterine incision made during the first cesarean delivery. A previous transverse uterine incision has the lowest risk of rupture (0.2 to 1.5 percent risk). Vertical or T-shaped uterine incisions have a higher risk of uterine rupture (4 to 9 percent risk)The risk of fetal death is very low with both VBAC and elective repeat cesarean delivery (ERCD), but the likelihood of fetal death is higher with VBAC than with ERCD. Maternal death is very rare with either type of delivery. The risks of an elective repeat cesarean delivery (ERCD) were reviewed with the patient including but not limited to: 03/998 risk of uterine rupture which could have  serious consequences, bleeding which may require transfusion; infection which may require antibiotics; injury to bowel, bladder or other surrounding organs (bowel, bladder, ureters); injury to the fetus; need for additional procedures including hysterectomy in the event of a life-threatening hemorrhage; thromboembolic phenomenon; abnormal placentation; incisional problems; death and other postoperative or anesthesia complications.    In addition we discussed that our collective office practice is to allow patient's who desire to attempt TOLAC to go into labor naturally.  There is some limited data that rupture rate may increase past [redacted] weeks gestation, but it is reasonable for women who are strongly committed to [facility] to continue pregnancy into the 41st week.  Medical indications necessetating early delivery may arise during the course of any pregnancy.  Given the contraindication on the use of prostaglandins for use in cervical ripening,  recommendation would be to proceed with repeat cesarean for delivery for patient's with unfavorable cervix (low Bishops score) who reach 41 weeks or who otherwise have a medical indication for early delivery.   These risks and benefits are summarized on the consent form, which was reviewed with the patient during the visit.  All her questions answered and she signed a consent indicating a preference for TOLAC/ERCD. A copy of the consent was given to the patient.  Vaginal Birth After Cesarean Delivery  Vaginal birth after cesarean delivery (VBAC) is giving birth vaginally after previously delivering a baby through a cesarean section (C-section). A VBAC may be a safe option for you, depending on your health and other factors. It is important to discuss VBAC with your health care provider early in your pregnancy  so you can understand the risks, benefits, and options. Having these discussions early will give you time to make your birth plan. Who are the best candidates for  VBAC? The best candidates for VBAC are women who:  Have had one or two prior cesarean deliveries, and the incision made during the delivery was horizontal (low transverse).  Do not have a vertical (classical) scar on their uterus.  Have not had a tear in the wall of their uterus (uterine rupture).  Plan to have more pregnancies. A VBAC is also more likely to be successful:  In women who have previously given birth vaginally.  When labor starts by itself (spontaneously) before the due date. What are the benefits of VBAC? The benefits of delivering your baby vaginally instead of by a cesarean delivery include:  A shorter hospital stay.  A faster recovery time.  Less pain.  Avoiding risks associated with major surgery, such as infection and blood clots.  Less blood loss and less need for donated blood (transfusions). What are the risks of VBAC? The main risk of attempting a VBAC is that it may fail, forcing your health care provider to deliver your baby by a C-section. Other risks are rare and include:  Tearing (rupture) of the scar from a past cesarean delivery.  Other risks associated with vaginal deliveries. If a repeat cesarean delivery is needed, the risks include:  Blood loss.  Infection.  Blood clot.  Damage to surrounding organs.  Removal of the uterus (hysterectomy), if it is damaged.  Placenta problems in future pregnancies. What else should I know about my options? Delivering a baby through a VBAC is similar to having a normal spontaneous vaginal delivery. Therefore, it is safe:  To try with twins.  For your health care provider to try to turn the baby from a breech position (external cephalic version) during labor.  With epidural analgesia for pain relief. Consider where you would like to deliver your baby. VBAC should be attempted in facilities where an emergency cesarean delivery can be performed. VBAC is not recommended for home births. Any changes  in your health or your babys health during your pregnancy may make it necessary to change your initial decision about VBAC. Your health care provider may recommend that you do not attempt a VBAC if:  Your baby's suspected weight is 8.8 lb (4 kg) or more.  You have preeclampsia. This is a condition that causes high blood pressure along with other symptoms, such as swelling and headaches.  You will have VBAC less than 19 months after your cesarean delivery.  You are past your due date.  You need to have labor started (induced) because your cervix is not ready for labor (unfavorable). Where to find more information  American Pregnancy Association: americanpregnancy.org  [employer]: acog.org Summary  Vaginal birth after cesarean delivery (VBAC) is giving birth vaginally after previously delivering a baby through a cesarean section (C-section). A VBAC may be a safe option for you, depending on your health and other factors.  Discuss VBAC with your health care provider early in your pregnancy so you can understand the risks, benefits, options, and have plenty of time to make your birth plan.  The main risk of attempting a VBAC is that it may fail, forcing your health care provider to deliver your baby by a C-section. Other risks are rare. This information is not intended to replace advice given to you by your health care provider. Make sure  you discuss any questions you have with your health care provider. Document Revised: 05/21/2018 Document Reviewed: 05/02/2016 Elsevier Patient Education  2020 [employer].   Third Trimester of Pregnancy The third trimester is from week 28 through week 40 (months 7 through 9). The third trimester is a time when the unborn baby (fetus) is growing rapidly. At the end of the ninth month, the fetus is about 20 inches in length and weighs 6-10 pounds. Body changes during your third trimester Your body will continue  to go through many changes during pregnancy. The changes vary from woman to woman. During the third trimester:  Your weight will continue to increase. You can expect to gain 25-35 pounds (11-16 kg) by the end of the pregnancy.  You may begin to get stretch marks on your hips, abdomen, and breasts.  You may urinate more often because the fetus is moving lower into your pelvis and pressing on your bladder.  You may develop or continue to have heartburn. This is caused by increased hormones that slow down muscles in the digestive tract.  You may develop or continue to have constipation because increased hormones slow digestion and cause the muscles that push waste through your intestines to relax.  You may develop hemorrhoids. These are swollen veins (varicose veins) in the rectum that can itch or be painful.  You may develop swollen, bulging veins (varicose veins) in your legs.  You may have increased body aches in the pelvis, back, or thighs. This is due to weight gain and increased hormones that are relaxing your joints.  You may have changes in your hair. These can include thickening of your hair, rapid growth, and changes in texture. Some women also have hair loss during or after pregnancy, or hair that feels dry or thin. Your hair will most likely return to normal after your baby is born.  Your breasts will continue to grow and they will continue to become tender. A yellow fluid (colostrum) may leak from your breasts. This is the first milk you are producing for your baby.  Your belly button may stick out.  You may notice more swelling in your hands, face, or ankles.  You may have increased tingling or numbness in your hands, arms, and legs. The skin on your belly may also feel numb.  You may feel short of breath because of your expanding uterus.  You may have more problems sleeping. This can be caused by the size of your belly, increased need to urinate, and an increase in your  body's metabolism.  You may notice the fetus "dropping," or moving lower in your abdomen (lightening).  You may have increased vaginal discharge.  You may notice your joints feel loose and you may have pain around your pelvic bone. What to expect at prenatal visits You will have prenatal exams every 2 weeks until week 36. Then you will have weekly prenatal exams. During a routine prenatal visit:  You will be weighed to make sure you and the baby are growing normally.  Your blood pressure will be taken.  Your abdomen will be measured to track your baby's growth.  The fetal heartbeat will be listened to.  Any test results from the previous visit will be discussed.  You may have a cervical check near your due date to see if your cervix has softened or thinned (effaced).  You will be tested for Group B streptococcus. This happens between 35 and 37 weeks. Your health care provider may ask  you:  What your birth plan is.  How you are feeling.  If you are feeling the baby move.  If you have had any abnormal symptoms, such as leaking fluid, bleeding, severe headaches, or abdominal cramping.  If you are using any tobacco products, including cigarettes, chewing tobacco, and electronic cigarettes.  If you have any questions. Other tests or screenings that may be performed during your third trimester include:  Blood tests that check for low iron levels (anemia).  Fetal testing to check the health, activity level, and growth of the fetus. Testing is done if you have certain medical conditions or if there are problems during the pregnancy.  Nonstress test (NST). This test checks the health of your baby to make sure there are no signs of problems, such as the baby not getting enough oxygen. During this test, a belt is placed around your belly. The baby is made to move, and its heart rate is monitored during movement. What is false labor? False labor is a condition in which you feel small,  irregular tightenings of the muscles in the womb (contractions) that usually go away with rest, changing position, or drinking water. These are called Braxton Hicks contractions. Contractions may last for hours, days, or even weeks before true labor sets in. If contractions come at regular intervals, become more frequent, increase in intensity, or become painful, you should see your health care provider. What are the signs of labor?  Abdominal cramps.  Regular contractions that start at 10 minutes apart and become stronger and more frequent with time.  Contractions that start on the top of the uterus and spread down to the lower abdomen and back.  Increased pelvic pressure and dull back pain.  A watery or bloody mucus discharge that comes from the vagina.  Leaking of amniotic fluid. This is also known as your "water breaking." It could be a slow trickle or a gush. Let your health care provider know if it has a color or strange odor. If you have any of these signs, call your health care provider right away, even if it is before your due date. Follow these instructions at home: Medicines  Follow your health care provider's instructions regarding medicine use. Specific medicines may be either safe or unsafe to take during pregnancy.  Take a prenatal vitamin that contains at least 600 micrograms (mcg) of folic acid.  If you develop constipation, try taking a stool softener if your health care provider approves. Eating and drinking   Eat a balanced diet that includes fresh fruits and vegetables, whole grains, good sources of protein such as meat, eggs, or tofu, and low-fat dairy. Your health care provider will help you determine the amount of weight gain that is right for you.  Avoid raw meat and uncooked cheese. These carry germs that can cause birth defects in the baby.  If you have low calcium intake from food, talk to your health care provider about whether you should take a daily  calcium supplement.  Eat four or five small meals rather than three large meals a day.  Limit foods that are high in fat and processed sugars, such as fried and sweet foods.  To prevent constipation: ? Drink enough fluid to keep your urine clear or pale yellow. ? Eat foods that are high in fiber, such as fresh fruits and vegetables, whole grains, and beans. Activity  Exercise only as directed by your health care provider. Most women can continue their usual exercise routine  during pregnancy. Try to exercise for 30 minutes at least 5 days a week. Stop exercising if you experience uterine contractions.  Avoid heavy lifting.  Do not exercise in extreme heat or humidity, or at high altitudes.  Wear low-heel, comfortable shoes.  Practice good posture.  You may continue to have sex unless your health care provider tells you otherwise. Relieving pain and discomfort  Take frequent breaks and rest with your legs elevated if you have leg cramps or low back pain.  Take warm sitz baths to soothe any pain or discomfort caused by hemorrhoids. Use hemorrhoid cream if your health care provider approves.  Wear a good support bra to prevent discomfort from breast tenderness.  If you develop varicose veins: ? Wear support pantyhose or compression stockings as told by your healthcare provider. ? Elevate your feet for 15 minutes, 3-4 times a day. Prenatal care  Write down your questions. Take them to your prenatal visits.  Keep all your prenatal visits as told by your health care provider. This is important. Safety  Wear your seat belt at all times when driving.  Make a list of emergency phone numbers, including numbers for family, friends, the hospital, and police and fire departments. General instructions  Avoid cat litter boxes and soil used by cats. These carry germs that can cause birth defects in the baby. If you have a cat, ask someone to clean the litter box for you.  Do not travel  far distances unless it is absolutely necessary and only with the approval of your health care provider.  Do not use hot tubs, steam rooms, or saunas.  Do not drink alcohol.  Do not use any products that contain nicotine or tobacco, such as cigarettes and e-cigarettes. If you need help quitting, ask your health care provider.  Do not use any medicinal herbs or unprescribed drugs. These chemicals affect the formation and growth of the baby.  Do not douche or use tampons or scented sanitary pads.  Do not cross your legs for long periods of time.  To prepare for the arrival of your baby: ? Take prenatal classes to understand, practice, and ask questions about labor and delivery. ? Make a trial run to the hospital. ? Visit the hospital and tour the maternity area. ? Arrange for maternity or paternity leave through employers. ? Arrange for family and friends to take care of pets while you are in the hospital. ? Purchase a rear-facing car seat and make sure you know how to install it in your car. ? Pack your hospital bag. ? Prepare the babys nursery. Make sure to remove all pillows and stuffed animals from the baby's crib to prevent suffocation.  Visit your dentist if you have not gone during your pregnancy. Use a soft toothbrush to brush your teeth and be gentle when you floss. Contact a health care provider if:  You are unsure if you are in labor or if your water has broken.  You become dizzy.  You have mild pelvic cramps, pelvic pressure, or nagging pain in your abdominal area.  You have lower back pain.  You have persistent nausea, vomiting, or diarrhea.  You have an unusual or bad smelling vaginal discharge.  You have pain when you urinate. Get help right away if:  Your water breaks before 37 weeks.  You have regular contractions less than 5 minutes apart before 37 weeks.  You have a fever.  You are leaking fluid from your vagina.  You have  spotting or bleeding from  your vagina.  You have severe abdominal pain or cramping.  You have rapid weight loss or weight gain.  You have shortness of breath with chest pain.  You notice sudden or extreme swelling of your face, hands, ankles, feet, or legs.  Your baby makes fewer than 10 movements in 2 hours.  You have severe headaches that do not go away when you take medicine.  You have vision changes. Summary  The third trimester is from week 28 through week 40, months 7 through 9. The third trimester is a time when the unborn baby (fetus) is growing rapidly.  During the third trimester, your discomfort may increase as you and your baby continue to gain weight. You may have abdominal, leg, and back pain, sleeping problems, and an increased need to urinate.  During the third trimester your breasts will keep growing and they will continue to become tender. A yellow fluid (colostrum) may leak from your breasts. This is the first milk you are producing for your baby.  False labor is a condition in which you feel small, irregular tightenings of the muscles in the womb (contractions) that eventually go away. These are called Braxton Hicks contractions. Contractions may last for hours, days, or even weeks before true labor sets in.  Signs of labor can include: abdominal cramps; regular contractions that start at 10 minutes apart and become stronger and more frequent with time; watery or bloody mucus discharge that comes from the vagina; increased pelvic pressure and dull back pain; and leaking of amniotic fluid. This information is not intended to replace advice given to you by your health care provider. Make sure you discuss any questions you have with your health care provider. Document Revised: 05/16/2018 Document Reviewed: 02/29/2016 Elsevier Patient Education  2020 [employer].

## 2019-08-31 ENCOUNTER — Observation Stay
Admission: EM | Admit: 2019-08-31 | Discharge: 2019-08-31 | Disposition: A | Discharge status: Home / Self Care | Payer: BC Managed Care – PPO | Attending: Obstetrics and Gynecology | Admitting: Obstetrics and Gynecology

## 2019-08-31 ENCOUNTER — Other Ambulatory Visit: Payer: Self-pay

## 2019-08-31 ENCOUNTER — Encounter: Payer: Self-pay | Admitting: Obstetrics and Gynecology

## 2019-08-31 DIAGNOSIS — O99213 Obesity complicating pregnancy, third trimester: Secondary | ICD-10-CM | POA: Diagnosis not present

## 2019-08-31 DIAGNOSIS — O0990 Supervision of high risk pregnancy, unspecified, unspecified trimester: Secondary | ICD-10-CM | POA: Diagnosis not present

## 2019-08-31 DIAGNOSIS — O26853 Spotting complicating pregnancy, third trimester: Secondary | ICD-10-CM | POA: Diagnosis not present

## 2019-08-31 DIAGNOSIS — O09813 Supervision of pregnancy resulting from assisted reproductive technology, third trimester: Secondary | ICD-10-CM

## 2019-08-31 DIAGNOSIS — O219 Vomiting of pregnancy, unspecified: Secondary | ICD-10-CM

## 2019-08-31 DIAGNOSIS — O4693 Antepartum hemorrhage, unspecified, third trimester: Secondary | ICD-10-CM

## 2019-08-31 DIAGNOSIS — O34219 Maternal care for unspecified type scar from previous cesarean delivery: Secondary | ICD-10-CM

## 2019-08-31 DIAGNOSIS — Z79899 Other long term (current) drug therapy: Secondary | ICD-10-CM | POA: Diagnosis not present

## 2019-08-31 DIAGNOSIS — Z3A35 35 weeks gestation of pregnancy: Secondary | ICD-10-CM

## 2019-08-31 DIAGNOSIS — Z6841 Body Mass Index (BMI) 40.0 and over, adult: Secondary | ICD-10-CM

## 2019-08-31 DIAGNOSIS — O26899 Other specified pregnancy related conditions, unspecified trimester: Secondary | ICD-10-CM

## 2019-08-31 NOTE — Discharge Summary (Signed)
Physician Final Progress Note  Patient ID: Tammy Chase MRN: [id number] DOB/AGE: 1985-12-21 34 y.o.  Admit date: 08/31/2019 Admitting provider: Tresea Mall, CNM Discharge date: 08/31/2019   Admission Diagnoses: vaginal spotting when wiping  Discharge Diagnoses:  Active Problems:   Vaginal bleeding in pregnancy, third trimester Reactive NST No active bleeding  History of Present Illness: The patient is a 34 y.o. female G2P1001 at [redacted]w[redacted]d who presents for bright red bleeding/mucous discharge when wiping this morning around 11:30. Since admission in observation she had brown spotting with wiping. She has not worn a pad. She denies recent intercourse. She admits positive fetal movement and denies any abdominal discomfort/cramping/contractions, or leakage of fluid.  Monitoring is reassuring. Patient is discharged to home with instructions and precautions.     Past Medical History:  Diagnosis Date  . Obesity affecting pregnancy   . PCOS (polycystic ovarian syndrome)     Past Surgical History:  Procedure Laterality Date  . CESAREAN SECTION N/A 09/25/2014   Procedure: CESAREAN SECTION;  Surgeon: [place] Bing, MD;  Location: [facility] ORS;  Service: Obstetrics;  Laterality: N/A;    No current facility-administered medications on file prior to encounter.   Current Outpatient Medications on File Prior to Encounter  Medication Sig Dispense Refill  . Prenatal Vit-Fe Fumarate-FA (MULTIVITAMIN-PRENATAL) 27-0.8 MG TABS tablet Take 1 tablet by mouth daily at 12 noon.    Marland Kitchen DICLEGIS 10-10 MG TBEC Take 1 tablet by mouth as directed. Continue taking as before (1 AM, 1 afternoon, 2 HS) (Patient not taking: Reported on 08/31/2019) 120 tablet 2  . metoCLOPramide (REGLAN) 10 MG tablet Take 1 tablet (10 mg total) by mouth every 8 (eight) hours as needed for nausea or vomiting. (Patient not taking: Reported on 08/31/2019) 90 tablet 2    No Known Allergies  Social History   Socioeconomic History  .  Marital status: Married    Spouse name: Not on file  . Number of children: Not on file  . Years of education: Not on file  . Highest education level: Not on file  Occupational History  . Not on file  Tobacco Use  . Smoking status: Never Smoker  . Smokeless tobacco: Never Used  Vaping Use  . Vaping Use: Never used  Substance and Sexual Activity  . Alcohol use: Not Currently    Comment: occasional  . Drug use: No  . Sexual activity: Yes    Comment: undecided  Other Topics Concern  . Not on file  Social History Narrative  . Not on file   Social Determinants of Health   Financial Resource Strain:   . Difficulty of Paying Living Expenses:   Food Insecurity:   . Worried About Programme researcher, broadcasting/film/video in the Last Year:   . Barista in the Last Year:   Transportation Needs:   . Freight forwarder (Medical):   Marland Kitchen Lack of Transportation (Non-Medical):   Physical Activity:   . Days of Exercise per Week:   . Minutes of Exercise per Session:   Stress:   . Feeling of Stress :   Social Connections:   . Frequency of Communication with Friends and Family:   . Frequency of Social Gatherings with Friends and Family:   . Attends Religious Services:   . Active Member of Clubs or Organizations:   . Attends Banker Meetings:   Marland Kitchen Marital Status:   Intimate Partner Violence:   . Fear of Current or Ex-Partner:   . Emotionally Abused:   .  Physically Abused:   . Sexually Abused:     Family History  Problem Relation Age of Onset  . Cancer Brother        Pancreatic  . Diabetes Maternal Grandmother   . Stroke Maternal Grandmother   . Cancer Maternal Grandfather        Lung (smoker)     Review of Systems  Constitutional: Negative for chills and fever.  HENT: Negative for congestion, ear discharge, ear pain, hearing loss, sinus pain and sore throat.   Eyes: Negative for blurred vision and double vision.  Respiratory: Negative for cough, shortness of breath and  wheezing.   Cardiovascular: Negative for chest pain, palpitations and leg swelling.  Gastrointestinal: Negative for abdominal pain, blood in stool, constipation, diarrhea, heartburn, melena, nausea and vomiting.  Genitourinary: Negative for dysuria, flank pain, frequency, hematuria and urgency.       Positive for vaginal spotting/mucous  Musculoskeletal: Negative for back pain, joint pain and myalgias.  Skin: Negative for itching and rash.  Neurological: Negative for dizziness, tingling, tremors, sensory change, speech change, focal weakness, seizures, loss of consciousness, weakness and headaches.  Endo/Heme/Allergies: Negative for environmental allergies. Does not bruise/bleed easily.  Psychiatric/Behavioral: Negative for depression, hallucinations, memory loss, substance abuse and suicidal ideas. The patient is not nervous/anxious and does not have insomnia.      Physical Exam: Temp 98.2 F (36.8 C) (Oral)   Resp 18   Ht 5\' 1"  (1.549 m)   Wt (!) 103.9 kg   LMP 01/03/2019   BMI 43.27 kg/m   Constitutional: Well nourished, well developed female in no acute distress.  HEENT: normal Skin: Warm and dry.  Cardiovascular: Regular rate and rhythm.   Extremity: trace edema  Respiratory: Clear to auscultation bilateral. Normal respiratory effort Abdomen: FHT present Back: no CVAT Neuro: DTRs 2+, Cranial nerves grossly intact Psych: Alert and Oriented x3. No memory deficits. Normal mood and affect.  MS: normal gait, normal bilateral lower extremity ROM/strength/stability.  Pelvic exam: (female chaperone present) sterile speculum exam is not limited by body habitus EGBUS: within normal limits Vagina: within normal limits and with normal mucosa, brown blood in the vault, no active bleeding, cervix appears closed Cervix: no digital exam  Toco: negative for contractions Fetal well being: 135 bpm, moderate variability, +accelerations, -decelerations   Consults: None  Significant  Findings/ Diagnostic Studies: none  Procedures: NST  Hospital Course: The patient was admitted to Labor and Delivery Triage for observation.   Discharge Condition: good  Disposition: Discharge disposition: 01-Home or Self Care  Diet: Regular diet  Discharge Activity: Activity as tolerated  Discharge Instructions    Discharge activity:  No Restrictions   Complete by: As directed    Discharge diet:  No restrictions   Complete by: As directed    Fetal Kick Count:  Lie on our left side for one hour after a meal, and count the number of times your baby kicks.  If it is less than 5 times, get up, move around and drink some juice.  Repeat the test 30 minutes later.  If it is still less than 5 kicks in an hour, notify your doctor.   Complete by: As directed    No sexual activity restrictions   Complete by: As directed    Notify physician for a general feeling that "something is not right"   Complete by: As directed    Notify physician for increase or change in vaginal discharge   Complete by: As directed  Notify physician for intestinal cramps, with or without diarrhea, sometimes described as "gas pain"   Complete by: As directed    Notify physician for leaking of fluid   Complete by: As directed    Notify physician for low, dull backache, unrelieved by heat or Tylenol   Complete by: As directed    Notify physician for menstrual like cramps   Complete by: As directed    Notify physician for pelvic pressure   Complete by: As directed    Notify physician for uterine contractions.  These may be painless and feel like the uterus is tightening or the baby is  "balling up"   Complete by: As directed    Notify physician for vaginal bleeding   Complete by: As directed    PRETERM LABOR:  Includes any of the follwing symptoms that occur between 20 - [redacted] weeks gestation.  If these symptoms are not stopped, preterm labor can result in preterm delivery, placing your baby at risk   Complete by:  As directed      Allergies as of 08/31/2019   No Known Allergies     Medication List    STOP taking these medications   Diclegis 10-10 MG Tbec Generic drug: Doxylamine-Pyridoxine   metoCLOPramide 10 MG tablet Commonly known as: REGLAN     TAKE these medications   multivitamin-prenatal 27-0.8 MG Tabs tablet Take 1 tablet by mouth daily at 12 noon.       Follow-up Information    [facility]. Go to.   Specialty: Obstetrics and Gynecology Why: scheduled prenatal appointment Contact information: [address] [place] [address] [phone]              Total time spent taking care of this patient: 30 minutes  Signed: Tresea Mall, CNM  08/31/2019, 1:29 PM

## 2019-08-31 NOTE — OB Triage Note (Signed)
Pt noticed vaginal bleeding and clots when she wiped at around 1130 today. No bright red bleeding since. Reports good fetal movements. Denies LOF. Elaina Hoops

## 2019-09-04 ENCOUNTER — Other Ambulatory Visit: Payer: Self-pay

## 2019-09-04 ENCOUNTER — Ambulatory Visit (INDEPENDENT_AMBULATORY_CARE_PROVIDER_SITE_OTHER): Payer: BC Managed Care – PPO | Admitting: Obstetrics and Gynecology

## 2019-09-04 VITALS — BP 122/75 | HR 93 | Wt 228.0 lb

## 2019-09-04 DIAGNOSIS — O09813 Supervision of pregnancy resulting from assisted reproductive technology, third trimester: Secondary | ICD-10-CM

## 2019-09-04 DIAGNOSIS — Z3A36 36 weeks gestation of pregnancy: Secondary | ICD-10-CM

## 2019-09-04 DIAGNOSIS — O0990 Supervision of high risk pregnancy, unspecified, unspecified trimester: Secondary | ICD-10-CM | POA: Diagnosis not present

## 2019-09-04 DIAGNOSIS — Z3685 Encounter for antenatal screening for Streptococcus B: Secondary | ICD-10-CM

## 2019-09-04 DIAGNOSIS — O0993 Supervision of high risk pregnancy, unspecified, third trimester: Secondary | ICD-10-CM

## 2019-09-04 DIAGNOSIS — O99213 Obesity complicating pregnancy, third trimester: Secondary | ICD-10-CM

## 2019-09-04 DIAGNOSIS — Z6791 Unspecified blood type, Rh negative: Secondary | ICD-10-CM

## 2019-09-04 DIAGNOSIS — O34219 Maternal care for unspecified type scar from previous cesarean delivery: Secondary | ICD-10-CM

## 2019-09-04 DIAGNOSIS — O26893 Other specified pregnancy related conditions, third trimester: Secondary | ICD-10-CM

## 2019-09-04 DIAGNOSIS — O4693 Antepartum hemorrhage, unspecified, third trimester: Secondary | ICD-10-CM | POA: Diagnosis not present

## 2019-09-04 LAB — POCT URINALYSIS DIPSTICK OB
Glucose, UA: NEGATIVE
POC,PROTEIN,UA: NEGATIVE

## 2019-09-04 NOTE — Progress Notes (Signed)
Routine Prenatal Care Visit  Subjective  Tammy Chase is a 34 y.o. G2P1001 at [redacted]w[redacted]d being seen today for ongoing prenatal care.  She is currently monitored for the following issues for this high-risk pregnancy and has Pregnancy; Infertility, anovulation; Irregular menstrual bleeding; Supervision of high risk pregnancy, antepartum; Pregnancy conceived through in vitro fertilization; Obesity affecting pregnancy; BMI 40.0-44.9, adult (HCC); History of cesarean delivery affecting pregnancy; Rh negative state in antepartum period; Nausea and vomiting during pregnancy; and Vaginal bleeding in pregnancy, third trimester on their problem list.  ----------------------------------------------------------------------------------- Patient reports no complaints.  Was seen 4 days ago for vaginal bleeding on L&D.  Reactive NST no further laboratroy evaluation.  Bleeding subsided but still some brown tinge to vaginal discharge Contractions: Irritability. Vag. Bleeding: Scant.  Movement: Present. Denies leaking of fluid.  ----------------------------------------------------------------------------------- The following portions of the patient's history were reviewed and updated as appropriate: allergies, current medications, past family history, past medical history, past social history, past surgical history and problem list. Problem list updated.   Objective  Blood pressure 122/75, pulse 93, weight (!) 228 lb (103.4 kg), last menstrual period 01/03/2019. Pregravid weight 225 lb (102.1 kg) Total Weight Gain 3 lb (1.361 kg) Urinalysis:      Fetal Status: Fetal Heart Rate (bpm): 140 Fundal Height: 37 cm Movement: Present  Presentation: Vertex  General:  Alert, oriented and cooperative. Patient is in no acute distress.  Skin: Skin is warm and dry. No rash noted.   Cardiovascular: Normal heart rate noted  Respiratory: Normal respiratory effort, no problems with respiration noted  Abdomen: Soft, gravid,  appropriate for gestational age. Pain/Pressure: Present     Pelvic:  Cervical exam performed Dilation: Closed Effacement (%): 50 Station: -3  Extremities: Normal range of motion.     ental Status: Normal mood and affect. Normal behavior. Normal judgment and thought content.     Assessment   34 y.o. G2P1001 at [redacted]w[redacted]d by  10/01/2019, by Other Basis presenting for routine prenatal visit  Plan   Pregnancy#2 Problems (from 01/03/19 to present)    Problem Noted Resolved   Rh negative state in antepartum period 03/12/2019 by Conard Novak, MD No   Overview Signed 03/12/2019  9:23 AM by Conard Novak, MD    [ ]  Rhogam per protocol      Nausea and vomiting during pregnancy 03/12/2019 by 05/10/2019, MD No   Supervision of high risk pregnancy, antepartum 02/27/2019 by 03/01/2019, MD No   Overview Addendum 08/19/2019  3:59 PM by 08/21/2019, MD    [facility]inic [facility] Prenatal Labs  Dating Day 5 embryo xfer, 7wk Natale Milch Blood type: A/Negative/-- (01/28 0916)   Genetic Screen AFP:          NIPS: diploid XX Antibody:Negative (01/28 0916)  Anatomic 03-09-1985 complete Rubella: 3.52 (01/28 0916)  Varicella: Immune  GTT Early: 115 Third trimester: 152 3hr WNL RPR: Non Reactive (01/28 0916)   Rhogam  07/14/2019 HBsAg: Negative (01/28 0916)   TDaP vaccine   07/31/2019  Flu Shot: HIV: Non Reactive (01/28 0916)   Baby Food    Breast                            GBS:   Contraception  Undecided Pap: 2021 NIL  CBB     CS/VBAC [ ]  Desires TOLAC    Support Person  Husband: 2022  Previous Version   Pregnancy conceived through in vitro fertilization 02/27/2019 by Conard Novak, MD No   Overview Signed 02/27/2019 11:47 AM by Conard Novak, MD    []  fetal echo at 22 weeks - declined PGS - day 5 embryo transfer - on estrogen patches and vaginal progesterone until 10 weeks per [facility].      Obesity affecting pregnancy 02/27/2019 by 03/01/2019, MD No   Overview  Addendum 03/12/2019  9:23 AM by 05/10/2019, MD    [x]  early 1h gtt - 115      Previous Version   BMI 40.0-44.9, adult (HCC) 02/27/2019 by 11-19-1981, MD No   History of cesarean delivery affecting pregnancy 02/27/2019 by Conard Novak, MD No   Overview Signed 02/27/2019 11:48 AM by Conard Novak, MD    [ ]  decide of TOLAC          Gestational age appropriate obstetric precautions including but not limited to vaginal bleeding, contractions, leaking of fluid and fetal movement were reviewed in detail with the patient.    1) VB subsided but will check CBC and KN  2) NST and growth next week  Return in about 1 week (around 09/11/2019) for ROB and growth scan, NST.  Conard Novak, MD, [facility], [facility] 09/04/2019, 9:28 AM

## 2019-09-04 NOTE — Progress Notes (Signed)
ROB L&D 7/25 d/t bleeding w/clots GBS today

## 2019-09-05 LAB — CBC
Hematocrit: 36.2 % (ref 34.0–46.6)
Hemoglobin: 11.9 g/dL (ref 11.1–15.9)
MCH: 28 pg (ref 26.6–33.0)
MCHC: 32.9 g/dL (ref 31.5–35.7)
MCV: 85 fL (ref 79–97)
Platelets: 172 10*3/uL (ref 150–450)
RBC: 4.25 x10E6/uL (ref 3.77–5.28)
RDW: 16 % — ABNORMAL HIGH (ref 11.7–15.4)
WBC: 11.2 10*3/uL — ABNORMAL HIGH (ref 3.4–10.8)

## 2019-09-06 LAB — STREP GP B NAA: Strep Gp B NAA: POSITIVE — AB

## 2019-09-09 ENCOUNTER — Encounter: Payer: Self-pay | Admitting: Obstetrics and Gynecology

## 2019-09-09 ENCOUNTER — Ambulatory Visit: Payer: BC Managed Care – PPO

## 2019-09-09 ENCOUNTER — Observation Stay
Admission: EM | Admit: 2019-09-09 | Discharge: 2019-09-09 | Disposition: A | Discharge status: Home / Self Care | Payer: BC Managed Care – PPO | Attending: Obstetrics and Gynecology | Admitting: Obstetrics and Gynecology

## 2019-09-09 ENCOUNTER — Other Ambulatory Visit: Payer: Self-pay

## 2019-09-09 ENCOUNTER — Encounter: Payer: BC Managed Care – PPO | Admitting: Certified Nurse Midwife

## 2019-09-09 DIAGNOSIS — O0990 Supervision of high risk pregnancy, unspecified, unspecified trimester: Secondary | ICD-10-CM

## 2019-09-09 DIAGNOSIS — Z6791 Unspecified blood type, Rh negative: Secondary | ICD-10-CM

## 2019-09-09 DIAGNOSIS — O4693 Antepartum hemorrhage, unspecified, third trimester: Secondary | ICD-10-CM | POA: Diagnosis present

## 2019-09-09 DIAGNOSIS — O34219 Maternal care for unspecified type scar from previous cesarean delivery: Secondary | ICD-10-CM

## 2019-09-09 DIAGNOSIS — B951 Streptococcus, group B, as the cause of diseases classified elsewhere: Secondary | ICD-10-CM | POA: Insufficient documentation

## 2019-09-09 DIAGNOSIS — Z3A36 36 weeks gestation of pregnancy: Secondary | ICD-10-CM | POA: Diagnosis not present

## 2019-09-09 DIAGNOSIS — O99213 Obesity complicating pregnancy, third trimester: Secondary | ICD-10-CM

## 2019-09-09 DIAGNOSIS — Z6841 Body Mass Index (BMI) 40.0 and over, adult: Secondary | ICD-10-CM

## 2019-09-09 DIAGNOSIS — O26899 Other specified pregnancy related conditions, unspecified trimester: Secondary | ICD-10-CM

## 2019-09-09 DIAGNOSIS — O09813 Supervision of pregnancy resulting from assisted reproductive technology, third trimester: Secondary | ICD-10-CM

## 2019-09-09 DIAGNOSIS — O219 Vomiting of pregnancy, unspecified: Secondary | ICD-10-CM

## 2019-09-09 HISTORY — DX: Streptococcus, group b, as the cause of diseases classified elsewhere: B95.1

## 2019-09-09 LAB — KLEIHAUER-BETKE STAIN
# Vials RhIg: 1
Fetal Cells %: 0 %
Quantitation Fetal Hemoglobin: 0 mL

## 2019-09-09 LAB — FIBRINOGEN: Fibrinogen: 624 mg/dL — ABNORMAL HIGH (ref 210–475)

## 2019-09-09 LAB — CBC
HCT: 32.6 % — ABNORMAL LOW (ref 36.0–46.0)
Hemoglobin: 10.9 g/dL — ABNORMAL LOW (ref 12.0–15.0)
MCH: 28.8 pg (ref 26.0–34.0)
MCHC: 33.4 g/dL (ref 30.0–36.0)
MCV: 86 fL (ref 80.0–100.0)
Platelets: 165 10*3/uL (ref 150–400)
RBC: 3.79 MIL/uL — ABNORMAL LOW (ref 3.87–5.11)
RDW: 16.7 % — ABNORMAL HIGH (ref 11.5–15.5)
WBC: 11.8 10*3/uL — ABNORMAL HIGH (ref 4.0–10.5)
nRBC: 0 % (ref 0.0–0.2)

## 2019-09-09 LAB — APTT: aPTT: 28 seconds (ref 24–36)

## 2019-09-09 LAB — PROTIME-INR
INR: 1.1 (ref 0.8–1.2)
Prothrombin Time: 13.8 seconds (ref 11.4–15.2)

## 2019-09-09 MED ORDER — ACETAMINOPHEN 325 MG PO TABS: 650.0000 mg | ORAL_TABLET | ORAL | Status: DC | PRN

## 2019-09-09 NOTE — Discharge Summary (Signed)
Physician Final Progress Note  Patient ID: Tammy Chase MRN: [id number] DOB/AGE: 08/08/1985 34 y.o.  Admit date: 09/09/2019 Admitting provider: Vena Austria, MD Discharge date: 09/09/2019   Admission Diagnoses: Third trimester bleeding  Discharge Diagnoses:  Active Problems:   Third trimester bleeding  34 y.o. G2P1001 at [redacted]w[redacted]d by Estimated Date of Delivery: 10/01/19 presenting with third trimester bleeding.  The patient was see on 08/31/2019 for third trimester vaginal bleedding, evaluation was limited to physical exam and reactive NST at that time.  Bleeding subsided after evaluation and remained absent at the time of her ROB follow up on 09/04/2019.  At that visit CBC and KB were sent.  CBC returned normal KB still pending.  The patient has a repeat episode of light bleeding over the weekend.  This stopped on Sunday (2 days ago).  No LOF, +FM, irregular contractions.  No abdominal pain or trauma preceding episodes.  Patient does not have a history of HTN.   Laboratory work up here normal today with reactive NST.  KB is pending but the patient has to leave secondary to child care issues.  Will contact patient with KB results.  Consults: None  Significant Findings/ Diagnostic Studies:  Results for orders placed or performed during the hospital encounter of 09/09/19 (from the past 24 hour(s))  CBC on admission     Status: Abnormal   Collection Time: 09/09/19  4:11 PM  Result Value Ref Range   WBC 11.8 (H) 4.0 - 10.5 K/uL   RBC 3.79 (L) 3.87 - 5.11 MIL/uL   Hemoglobin 10.9 (L) 12.0 - 15.0 g/dL   HCT 78.2 (L) 36 - 46 %   MCV 86.0 80.0 - 100.0 fL   MCH 28.8 26.0 - 34.0 pg   MCHC 33.4 30.0 - 36.0 g/dL   RDW 95.6 (H) 21.3 - 08.6 %   Platelets 165 150 - 400 K/uL   nRBC 0.0 0.0 - 0.2 %  Fibrinogen     Status: Abnormal   Collection Time: 09/09/19  4:11 PM  Result Value Ref Range   Fibrinogen 624 (H) 210 - 475 mg/dL  Protime-INR     Status: None   Collection Time: 09/09/19  4:11 PM   Result Value Ref Range   Prothrombin Time 13.8 11.4 - 15.2 seconds   INR 1.1 0.8 - 1.2  APTT     Status: None   Collection Time: 09/09/19  4:11 PM  Result Value Ref Range   aPTT 28 24 - 36 seconds     Procedures: Baseline: 125 Variability: moderate Accelerations: present Decelerations: absent Tocometry: irritability The patient was monitored for 30 minutes, fetal heart rate tracing was deemed reactive, category I tracing,  Discharge Condition: good  Disposition: Discharge disposition: 01-Home or Self Care       Diet: Regular diet  Discharge Activity: Activity as tolerated  Discharge Instructions    Discharge activity:  No Restrictions   Complete by: As directed    Discharge diet:  No restrictions   Complete by: As directed    Fetal Kick Count:  Lie on our left side for one hour after a meal, and count the number of times your baby kicks.  If it is less than 5 times, get up, move around and drink some juice.  Repeat the test 30 minutes later.  If it is still less than 5 kicks in an hour, notify your doctor.   Complete by: As directed    LABOR:  When conractions begin, you should start  to time them from the beginning of one contraction to the beginning  of the next.  When contractions are 5 - 10 minutes apart or less and have been regular for at least an hour, you should call your health care provider.   Complete by: As directed    No sexual activity restrictions   Complete by: As directed    Notify physician for bleeding from the vagina   Complete by: As directed    Notify physician for blurring of vision or spots before the eyes   Complete by: As directed    Notify physician for chills or fever   Complete by: As directed    Notify physician for fainting spells, "black outs" or loss of consciousness   Complete by: As directed    Notify physician for increase in vaginal discharge   Complete by: As directed    Notify physician for leaking of fluid   Complete by: As  directed    Notify physician for pain or burning when urinating   Complete by: As directed    Notify physician for pelvic pressure (sudden increase)   Complete by: As directed    Notify physician for severe or continued nausea or vomiting   Complete by: As directed    Notify physician for sudden gushing of fluid from the vagina (with or without continued leaking)   Complete by: As directed    Notify physician for sudden, constant, or occasional abdominal pain   Complete by: As directed    Notify physician if baby moving less than usual   Complete by: As directed      Allergies as of 09/09/2019   No Known Allergies     Medication List    TAKE these medications   multivitamin-prenatal 27-0.8 MG Tabs tablet Take 1 tablet by mouth daily at 12 noon.        Total time spent taking care of this patient: 45 minutes  Signed: Vena Austria 09/09/2019, 7:28 PM

## 2019-09-09 NOTE — Progress Notes (Signed)
Pt stated that she needed to get home. RN asked the pt to allow time to inform Dr. Bonney Aid. Dr. Bonney Aid was contacted at 7:20p by phone. Dr. Bonney Aid went and spoke to the pt., a plan was made and the pt was told she would be discharged. After Dr. Chauncey Cruel left the pt's room, the pt was seen leaving the room at 7:25p. RN was unable to give the pt her discharge information.

## 2019-09-09 NOTE — OB Triage Note (Signed)
Dr Bonney Aid sent patient from office for testing/monitoring. Pt states she started having dark red bleeding that started last Sunday and turned to brown with a few quarter size clots during the weeks followed by more dark red bleeding this Sunday. Pt states baby is moving well. Pt denies pain today but was having cramping, pressure, and discomfort on Sunday and Monday 3/10.

## 2019-09-10 LAB — ABO/RH: ABO/RH(D): A NEG

## 2019-09-15 ENCOUNTER — Ambulatory Visit (INDEPENDENT_AMBULATORY_CARE_PROVIDER_SITE_OTHER): Payer: BC Managed Care – PPO

## 2019-09-15 ENCOUNTER — Encounter: Payer: Self-pay | Admitting: Obstetrics & Gynecology

## 2019-09-15 ENCOUNTER — Ambulatory Visit (INDEPENDENT_AMBULATORY_CARE_PROVIDER_SITE_OTHER): Payer: BC Managed Care – PPO | Admitting: Obstetrics & Gynecology

## 2019-09-15 ENCOUNTER — Other Ambulatory Visit: Payer: Self-pay

## 2019-09-15 VITALS — BP 122/80 | Wt 228.0 lb

## 2019-09-15 DIAGNOSIS — Z3A36 36 weeks gestation of pregnancy: Secondary | ICD-10-CM | POA: Diagnosis not present

## 2019-09-15 DIAGNOSIS — O99213 Obesity complicating pregnancy, third trimester: Secondary | ICD-10-CM | POA: Diagnosis not present

## 2019-09-15 DIAGNOSIS — O09813 Supervision of pregnancy resulting from assisted reproductive technology, third trimester: Secondary | ICD-10-CM

## 2019-09-15 DIAGNOSIS — O4693 Antepartum hemorrhage, unspecified, third trimester: Secondary | ICD-10-CM

## 2019-09-15 DIAGNOSIS — O0993 Supervision of high risk pregnancy, unspecified, third trimester: Secondary | ICD-10-CM | POA: Diagnosis not present

## 2019-09-15 DIAGNOSIS — O0990 Supervision of high risk pregnancy, unspecified, unspecified trimester: Secondary | ICD-10-CM

## 2019-09-15 DIAGNOSIS — Z3A37 37 weeks gestation of pregnancy: Secondary | ICD-10-CM

## 2019-09-15 DIAGNOSIS — O34219 Maternal care for unspecified type scar from previous cesarean delivery: Secondary | ICD-10-CM

## 2019-09-15 DIAGNOSIS — Z6841 Body Mass Index (BMI) 40.0 and over, adult: Secondary | ICD-10-CM

## 2019-09-15 LAB — POCT URINALYSIS DIPSTICK OB
Glucose, UA: NEGATIVE
POC,PROTEIN,UA: NEGATIVE

## 2019-09-15 NOTE — Patient Instructions (Signed)
Nonstress Test A nonstress test is a procedure that is done during pregnancy in order to check the baby's heartbeat. The procedure can help show if the baby (fetus) is healthy. It is commonly done when:  The baby is past his or her due date.  The pregnancy is high risk.  The baby is moving less than normal.  The mother has lost a pregnancy in the past.  The health care provider suspects a problem with the baby's growth.  There is too much or too little amniotic fluid. The procedure is often done in the third trimester of pregnancy to find out if an early delivery is needed and whether such a delivery is safe. During a nonstress test, the baby's heartbeat is monitored when the baby is resting and when the baby is moving. If the baby is healthy, the heart rate will increase when he or she moves or kicks and will return to normal when he or she rests. Tell a health care provider about:  Any allergies you have.  Any medical conditions you have.  All medicines you are taking, including vitamins, herbs, eye drops, creams, and over-the-counter medicines. What are the risks? There are no risks to you or your baby from a nonstress test. This procedure should not be painful or uncomfortable. What happens before the procedure?  Eat a meal right before the test or as directed by your health care provider. Food may help encourage the baby to move.  Use the restroom right before the test. What happens during the procedure?  Two monitors will be placed on your abdomen. One will record the baby's heart rate and the other will record the contractions of your uterus.  You may be asked to lie down on your side or to sit upright.  You may be given a button to press when you feel your baby move.  Your health care provider will listen to your baby's heartbeat and recorded it. He or she may also watch your baby's heartbeat on a screen.  If the baby seems to be sleeping, you may be asked to drink  some juice or soda, eat a snack, or change positions. The procedure may vary among health care providers and hospitals. What happens after the procedure?  Your health care provider will discuss the test results with you and make recommendations for the future. Depending on the results, your health care provider may order additional tests or another course of action.  If your health care provider gave you any diet or activity instructions, make sure to follow them.  Keep all follow-up visits as told by your health care provider. This is important. Summary  A nonstress test is a procedure that is done during pregnancy in order to check the baby's heartbeat. The procedure can help show if the baby is healthy.  The procedure is often done in the third trimester of pregnancy to find out if an early delivery is needed and whether such a delivery is safe.  During a nonstress test, the baby's heartbeat is monitored when the baby is resting and when the baby is moving. If the baby is healthy, the heart rate will increase when he or she moves or kicks and will return to normal when he or she rests.  Your health care provider will discuss the test results with you and make recommendations for the future. This information is not intended to replace advice given to you by your health care provider. Make sure you discuss any   questions you have with your health care provider. Document Revised: 05/04/2016 Document Reviewed: 05/04/2016 Elsevier Patient Education  2020 [employer]  

## 2019-09-15 NOTE — Progress Notes (Signed)
  Subjective  Fetal Movement? yes Contractions? No (rare mild one) Leaking Fluid? no Vaginal Bleeding? no  Objective  BP 122/80   Wt 228 lb (103.4 kg)   LMP 01/03/2019   BMI 43.08 kg/m  General: NAD Pumonary: no increased work of breathing Abdomen: gravid, non-tender Extremities: no edema Psychiatric: mood appropriate, affect full  A NST procedure was performed with FHR monitoring and a normal baseline established, appropriate time of 20-40 minutes of evaluation, and accels >2 seen w 15x15 characteristics.  Results show a REACTIVE NST.   Review of ULTRASOUND.    I have personally reviewed images and report of recent ultrasound done at [facility].    Plan of management to be discussed with patient. Vtx, AFI 9, growth nml  Assessment  34 y.o. G2P1001 at [redacted]w[redacted]d by  10/01/2019, by Other Basis presenting for routine prenatal visit  Plan   Problem List Items Addressed This Visit      Other   Pregnancy   Relevant Orders   POC Urinalysis Dipstick OB (Completed)   Supervision of high risk pregnancy, antepartum   Pregnancy conceived through in vitro fertilization - Primary   Relevant Orders   US OB Limited   Obesity affecting pregnancy   Relevant Orders   US OB Limited   BMI 40.0-44.9, adult (HCC)   History of cesarean delivery affecting pregnancy       Clinic [facility] Prenatal Labs  Dating Day 5 embryo xfer, 7wk [place] Blood type: A/Negative/-- (01/28 0916)   Genetic Screen AFP:          NIPS: diploid XX Antibody:Negative (01/28 0916)  Anatomic [place] complete Rubella: 3.52 (01/28 0916)  Varicella: Immune  GTT Early: 115 Third trimester: 152 3hr WNL RPR: Non Reactive (01/28 0916)   Rhogam  07/14/2019 HBsAg: Negative (01/28 0916)   TDaP vaccine   07/31/2019  Flu Shot: HIV: Non Reactive (01/28 0916)   Baby Food    Breast                            GBS:   Contraception  Undecided Pap: 2021 NIL  CBB     CS/VBAC [ ]  Desires TOLAC    Support Person  Husband:            Discussed CS if no labor by 40+ weeks; scheuduled for 8/27 at this time (8/31, 30 not available)  VBAC preferred and we will assist with that when she goes into labor  NST and AFI normal today, cont weekly  PNV, [facility]MC  Labor precautions discussed  02-10[birth year]1975, MD, Annamarie Major Ob/Gyn, [facility] 09/15/2019  3:03 PM

## 2019-09-17 ENCOUNTER — Telehealth: Payer: Self-pay | Admitting: Obstetrics & Gynecology

## 2019-09-23 NOTE — Telephone Encounter (Signed)
LM for pt to rtn call. 

## 2019-09-24 ENCOUNTER — Other Ambulatory Visit: Payer: Self-pay | Admitting: Obstetrics and Gynecology

## 2019-09-24 ENCOUNTER — Ambulatory Visit (INDEPENDENT_AMBULATORY_CARE_PROVIDER_SITE_OTHER): Payer: BC Managed Care – PPO

## 2019-09-24 ENCOUNTER — Other Ambulatory Visit: Admission: RE | Admit: 2019-09-24 | Payer: BC Managed Care – PPO | Source: Ambulatory Visit

## 2019-09-24 ENCOUNTER — Other Ambulatory Visit: Payer: Self-pay

## 2019-09-24 DIAGNOSIS — Z3A39 39 weeks gestation of pregnancy: Secondary | ICD-10-CM

## 2019-09-24 DIAGNOSIS — Z3689 Encounter for other specified antenatal screening: Secondary | ICD-10-CM

## 2019-09-25 ENCOUNTER — Ambulatory Visit (INDEPENDENT_AMBULATORY_CARE_PROVIDER_SITE_OTHER): Payer: BC Managed Care – PPO | Admitting: Obstetrics and Gynecology

## 2019-09-25 ENCOUNTER — Telehealth: Payer: Self-pay

## 2019-09-25 ENCOUNTER — Other Ambulatory Visit: Payer: Self-pay

## 2019-09-25 ENCOUNTER — Encounter: Payer: Self-pay | Admitting: Obstetrics and Gynecology

## 2019-09-25 VITALS — BP 114/72 | Wt 229.0 lb

## 2019-09-25 DIAGNOSIS — O26893 Other specified pregnancy related conditions, third trimester: Secondary | ICD-10-CM

## 2019-09-25 DIAGNOSIS — O34219 Maternal care for unspecified type scar from previous cesarean delivery: Secondary | ICD-10-CM

## 2019-09-25 DIAGNOSIS — Z3A39 39 weeks gestation of pregnancy: Secondary | ICD-10-CM | POA: Diagnosis not present

## 2019-09-25 DIAGNOSIS — O0993 Supervision of high risk pregnancy, unspecified, third trimester: Secondary | ICD-10-CM | POA: Diagnosis not present

## 2019-09-25 DIAGNOSIS — O0990 Supervision of high risk pregnancy, unspecified, unspecified trimester: Secondary | ICD-10-CM

## 2019-09-25 DIAGNOSIS — Z6791 Unspecified blood type, Rh negative: Secondary | ICD-10-CM

## 2019-09-25 DIAGNOSIS — O99213 Obesity complicating pregnancy, third trimester: Secondary | ICD-10-CM

## 2019-09-25 DIAGNOSIS — O09813 Supervision of pregnancy resulting from assisted reproductive technology, third trimester: Secondary | ICD-10-CM | POA: Diagnosis not present

## 2019-09-25 LAB — POCT URINALYSIS DIPSTICK OB
Glucose, UA: NEGATIVE
POC,PROTEIN,UA: NEGATIVE

## 2019-09-25 NOTE — Telephone Encounter (Signed)
Pt is wanting to schedule her C-Section sooner than what is scheduled. Please advise. She is aware you are out of office until tomorrow

## 2019-09-25 NOTE — Progress Notes (Signed)
Routine Prenatal Care Visit  Subjective  Tammy Chase is a 34 y.o. G2P1001 at [redacted]w[redacted]d being seen today for ongoing prenatal care.  She is currently monitored for the following issues for this high-risk pregnancy and has Pregnancy; Infertility, anovulation; Irregular menstrual bleeding; Supervision of high risk pregnancy, antepartum; Pregnancy conceived through in vitro fertilization; Obesity affecting pregnancy; BMI 40.0-44.9, adult (HCC); History of cesarean delivery affecting pregnancy; Rh negative state in antepartum period; Nausea and vomiting during pregnancy; Vaginal bleeding in pregnancy, third trimester; Positive GBS test; and Third trimester bleeding on their problem list.  ----------------------------------------------------------------------------------- Patient reports no complaints.   Contractions: Not present. Vag. Bleeding: None.  Movement: Present. Leaking Fluid denies.  ----------------------------------------------------------------------------------- The following portions of the patient's history were reviewed and updated as appropriate: allergies, current medications, past family history, past medical history, past social history, past surgical history and problem list. Problem list updated.  Objective  Blood pressure 114/72, weight 229 lb (103.9 kg), last menstrual period 01/03/2019. Pregravid weight 225 lb (102.1 kg) Total Weight Gain 4 lb (1.814 kg) Urinalysis: Urine Protein Negative  Urine Glucose Negative  Fetal Status: Fetal Heart Rate (bpm): 135   Movement: Present     General:  Alert, oriented and cooperative. Patient is in no acute distress.  Skin: Skin is warm and dry. No rash noted.   Cardiovascular: Normal heart rate noted  Respiratory: Normal respiratory effort, no problems with respiration noted  Abdomen: Soft, gravid, appropriate for gestational age. Pain/Pressure: Present     Pelvic:  Cervical exam performed Dilation: Closed Effacement (%): 50 Station: -3   Extremities: Normal range of motion.     Mental Status: Normal mood and affect. Normal behavior. Normal judgment and thought content.   NST: Baseline FHR: 135 beats/min Variability: moderate Accelerations: present Decelerations: absent Tocometry: not done  Interpretation:  INDICATIONS: vaginal bleeding and obesity in pregnancy RESULTS:  A NST procedure was performed with FHR monitoring and a normal baseline established, appropriate time of 20-40 minutes of evaluation, and accels >2 seen w 15x15 characteristics.  Results show a REACTIVE NST.    Assessment   34 y.o. G2P1001 at [redacted]w[redacted]d by  10/01/2019, by Other Basis presenting for routine prenatal visit  Plan   Pregnancy#2 Problems (from 01/03/19 to present)    Problem Noted Resolved   Positive GBS test 09/09/2019 by Vena Austria, MD No   Rh negative state in antepartum period 03/12/2019 by Conard Novak, MD No   Overview Signed 03/12/2019  9:23 AM by Conard Novak, MD    [ ]  Rhogam per protocol      Nausea and vomiting during pregnancy 03/12/2019 by 05/10/2019, MD No   Supervision of high risk pregnancy, antepartum 02/27/2019 by 03/01/2019, MD No   Overview Addendum 08/19/2019  3:59 PM by 08/21/2019, MD    Clinic [facility] Prenatal Labs  Dating Day 5 embryo xfer, 7wk Natale Milch Blood type: A/Negative/-- (01/28 0916)   Genetic Screen AFP:          NIPS: diploid XX Antibody:Negative (01/28 0916)  Anatomic [birth year]1987 complete Rubella: 3.52 (01/28 0916)  Varicella: Immune  GTT Early: 115 Third trimester: 152 3hr WNL RPR: Non Reactive (01/28 0916)   Rhogam  07/14/2019 HBsAg: Negative (01/28 0916)   TDaP vaccine   07/31/2019  Flu Shot: HIV: Non Reactive (01/28 0916)   Baby Food    Breast  GBS:   Contraception  Undecided Pap: 2021 NIL  CBB     CS/VBAC [ ]  Desires TOLAC    Support Person  Husband:           Previous Version   Pregnancy conceived through in vitro fertilization  02/27/2019 by 03/01/2019, MD No   Overview Signed 02/27/2019 11:47 AM by 03/01/2019, MD    []  fetal echo at 22 weeks - declined PGS - day 5 embryo transfer - on estrogen patches and vaginal progesterone until 10 weeks per [facility].      Obesity affecting pregnancy 02/27/2019 by , MD No   Overview Addendum 03/12/2019  9:23 AM by Conard Novak, MD    [x]  early 1h gtt - 115      Previous Version   BMI 40.0-44.9, adult (HCC) 02/27/2019 by , MD No   History of cesarean delivery affecting pregnancy 02/27/2019 by 03/01/2019, MD No   Overview Signed 02/27/2019 11:48 AM by 03/01/2019, MD    [ ]  decide of TOLAC          Term labor symptoms and general obstetric precautions including but not limited to vaginal bleeding, contractions, leaking of fluid and fetal movement were reviewed in detail with the patient. Please refer to After Visit Summary for other counseling recommendations.   -NST reactive today -She states she is leaning more to C-section. She doesn't want to be induced. C-section already scheduled for 8 days.  H&P in 4 days along with u/s for AFI, which is borderline today, but normal.    Return in about 4 days (around 09/29/2019) for Keep previously scheduled appts .  03/01/2019, MD, Conard Novak OB/GYN, [facility] 09/25/2019 2:12 PM

## 2019-09-26 ENCOUNTER — Telehealth: Payer: Self-pay | Admitting: Obstetrics and Gynecology

## 2019-09-26 NOTE — Telephone Encounter (Signed)
Called patient to adv of changed C/S date with Britt Boozer 8/24  H&P 8/23 @ 8:00am - [facility] 1st pt   Covid testing 8/23 @ 9-10, [address], drive up and wear mask. Advised pt to quarantine until DOS.  Pre-admit phone call 8/23 between 8am - 1pm  Advised that pt may also receive calls from the hospital pharmacy and pre-service center.  Confirmed pt has [employer] as Editor, commissioning. No secondary insurance.  Requested [facility] to place orders today for PAT.

## 2019-09-26 NOTE — Telephone Encounter (Signed)
Pt states she is wanting a sooner C-Section date bc she thinks it would help with her anxiety. She is requesting earlier next week. States that the 24th would be good. Pt Would like [facility] to do C-Section. Advised pt I would send this to [facility] to get this added to OR schedule and he will call her back.

## 2019-09-29 ENCOUNTER — Ambulatory Visit (INDEPENDENT_AMBULATORY_CARE_PROVIDER_SITE_OTHER): Payer: BC Managed Care – PPO | Admitting: Obstetrics and Gynecology

## 2019-09-29 ENCOUNTER — Encounter: Payer: Self-pay | Admitting: Obstetrics and Gynecology

## 2019-09-29 ENCOUNTER — Encounter: Payer: BC Managed Care – PPO | Admitting: Obstetrics & Gynecology

## 2019-09-29 ENCOUNTER — Encounter
Admission: RE | Admit: 2019-09-29 | Discharge: 2019-09-29 | Disposition: A | Discharge status: Home / Self Care | Payer: BC Managed Care – PPO | Source: Ambulatory Visit | Attending: Obstetrics & Gynecology | Admitting: Obstetrics & Gynecology

## 2019-09-29 ENCOUNTER — Ambulatory Visit: Payer: BC Managed Care – PPO

## 2019-09-29 ENCOUNTER — Other Ambulatory Visit
Admission: RE | Admit: 2019-09-29 | Discharge: 2019-09-29 | Disposition: A | Discharge status: Home / Self Care | Payer: BC Managed Care – PPO | Source: Ambulatory Visit | Attending: Obstetrics & Gynecology | Admitting: Obstetrics & Gynecology

## 2019-09-29 ENCOUNTER — Other Ambulatory Visit: Payer: Self-pay

## 2019-09-29 VITALS — BP 120/72 | Wt 230.0 lb

## 2019-09-29 DIAGNOSIS — O0993 Supervision of high risk pregnancy, unspecified, third trimester: Secondary | ICD-10-CM

## 2019-09-29 DIAGNOSIS — O34219 Maternal care for unspecified type scar from previous cesarean delivery: Secondary | ICD-10-CM

## 2019-09-29 DIAGNOSIS — Z6791 Unspecified blood type, Rh negative: Secondary | ICD-10-CM

## 2019-09-29 DIAGNOSIS — O09813 Supervision of pregnancy resulting from assisted reproductive technology, third trimester: Secondary | ICD-10-CM

## 2019-09-29 DIAGNOSIS — Z3A39 39 weeks gestation of pregnancy: Secondary | ICD-10-CM

## 2019-09-29 DIAGNOSIS — Z20822 Contact with and (suspected) exposure to covid-19: Secondary | ICD-10-CM | POA: Insufficient documentation

## 2019-09-29 DIAGNOSIS — O26893 Other specified pregnancy related conditions, third trimester: Secondary | ICD-10-CM

## 2019-09-29 DIAGNOSIS — O99213 Obesity complicating pregnancy, third trimester: Secondary | ICD-10-CM | POA: Diagnosis not present

## 2019-09-29 DIAGNOSIS — Z01812 Encounter for preprocedural laboratory examination: Secondary | ICD-10-CM | POA: Insufficient documentation

## 2019-09-29 HISTORY — DX: Anemia, unspecified: D64.9

## 2019-09-29 LAB — TYPE AND SCREEN
ABO/RH(D): A NEG
Antibody Screen: POSITIVE
Extend sample reason: UNDETERMINED

## 2019-09-29 LAB — CBC
HCT: 34.7 % — ABNORMAL LOW (ref 36.0–46.0)
Hemoglobin: 11.3 g/dL — ABNORMAL LOW (ref 12.0–15.0)
MCH: 28 pg (ref 26.0–34.0)
MCHC: 32.6 g/dL (ref 30.0–36.0)
MCV: 86.1 fL (ref 80.0–100.0)
Platelets: 143 10*3/uL — ABNORMAL LOW (ref 150–400)
RBC: 4.03 MIL/uL (ref 3.87–5.11)
RDW: 16.4 % — ABNORMAL HIGH (ref 11.5–15.5)
WBC: 9.1 10*3/uL (ref 4.0–10.5)
nRBC: 0 % (ref 0.0–0.2)

## 2019-09-29 LAB — RAPID HIV SCREEN (HIV 1/2 AB+AG)
HIV 1/2 Antibodies: NONREACTIVE
HIV-1 P24 Antigen - HIV24: NONREACTIVE

## 2019-09-29 LAB — SARS CORONAVIRUS 2 (TAT 6-24 HRS): SARS Coronavirus 2: NEGATIVE

## 2019-09-29 NOTE — H&P (View-Only) (Signed)
OB History & Physical   History of Present Illness:  Chief Complaint: Here for repeat c-section  HPI:  Tammy Chase is a 34 y.o. G55P1001 female at [redacted]w[redacted]d dated by embryo transfer.  Her pregnancy has been complicated by pregnancy through ART (S/p fetal echo), history of c-section, obesity in pregnancy, rh negative status, GBS positive.    She denies contractions.   She denies leakage of fluid.   She denies vaginal bleeding.   She reports fetal movement.    Total weight gain for pregnancy: 4 lb (1.814 kg)   Obstetrical Problem List: Pregnancy#2 Problems (from 01/03/19 to present)    Problem Noted Resolved   Positive GBS test 09/09/2019 by Vena Austria, MD No   Rh negative state in antepartum period 03/12/2019 by Conard Novak, MD No   Overview Signed 03/12/2019  9:23 AM by Conard Novak, MD    [ ]  Rhogam per protocol      Nausea and vomiting during pregnancy 03/12/2019 by 05/10/2019, MD No   Supervision of high risk pregnancy, antepartum 02/27/2019 by 03/01/2019, MD No   Overview Addendum 08/19/2019  3:59 PM by 08/21/2019, MD    Clinic [facility] Prenatal Labs  Dating Day 5 embryo xfer, 7wk Natale Milch Blood type: A/Negative/-- (01/28 0916)   Genetic Screen AFP:          NIPS: diploid XX Antibody:Negative (01/28 0916)  Anatomic [birth year]1987 complete Rubella: 3.52 (01/28 0916)  Varicella: Immune  GTT Early: 115 Third trimester: 152 3hr WNL RPR: Non Reactive (01/28 0916)   Rhogam  07/14/2019 HBsAg: Negative (01/28 0916)   TDaP vaccine   07/31/2019  Flu Shot: HIV: Non Reactive (01/28 0916)   Baby Food    Breast                            GBS:   Contraception  Undecided Pap: 2021 NIL  CBB     CS/VBAC [ ]  Desires TOLAC    Support Person  Husband: 2022           Previous Version   Pregnancy conceived through in vitro fertilization 02/27/2019 by Feliz Beam, MD No   Overview Signed 02/27/2019 11:47 AM by Conard Novak, MD    []  fetal echo at 22 weeks -  declined PGS - day 5 embryo transfer - on estrogen patches and vaginal progesterone until 10 weeks per [facility].      Obesity affecting pregnancy 02/27/2019 by Conard Novak, MD No   Overview Addendum 03/12/2019  9:23 AM by 03/01/2019, MD    [x]  early 1h gtt - 115      Previous Version   BMI 40.0-44.9, adult (HCC) 02/27/2019 by Conard Novak, MD No   History of cesarean delivery affecting pregnancy 02/27/2019 by [birth year]19[birth year]1981, MD No   Overview Signed 02/27/2019 11:48 AM by Conard Novak, MD    [ ]  decide of TOLAC          Maternal Medical History:   Past Medical History:  Diagnosis Date  . Obesity affecting pregnancy   . PCOS (polycystic ovarian syndrome)     Past Surgical History:  Procedure Laterality Date  . CESAREAN SECTION N/A 09/25/2014   Procedure: CESAREAN SECTION;  Surgeon: Conard Novak, MD;  Location: [facility] ORS;  Service: Obstetrics;  Laterality: N/A;    No Known Allergies  Prior to Admission medications  Medication Sig Start Date End Date Taking? Authorizing Provider  Prenatal Vit-Fe Fumarate-FA (MULTIVITAMIN-PRENATAL) 27-0.8 MG TABS tablet Take 1 tablet by mouth every evening.    Yes [provider]    OB History  Gravida Para Term Preterm AB Living  2 1 1     1   SAB TAB Ectopic Multiple Live Births        0 1    # Outcome Date GA Lbr Len/2nd Weight Sex Delivery Anes PTL Lv  2 Current           1 Term 09/25/14 [redacted]w[redacted]d  7 lb 5.1 oz (3.32 kg) F CS-LTranv EPI  LIV    Prenatal care site: [facility]  Social History: She  reports that she has never smoked. She has never used smokeless tobacco. She reports previous alcohol use. She reports that she does not use drugs.  Family History: family history includes Cancer in her brother and maternal grandfather; Diabetes in her maternal grandmother; Stroke in her maternal grandmother.   Review of Systems:  Review of Systems  Constitutional: Negative.   HENT: Negative.    Eyes: Negative.   Respiratory: Negative.   Cardiovascular: Negative.   Gastrointestinal: Negative.   Genitourinary: Negative.   Musculoskeletal: Negative.   Skin: Negative.   Neurological: Negative.   Psychiatric/Behavioral: Negative.      Physical Exam:  BP 120/72   Wt 230 lb (104.3 kg)   LMP 01/03/2019   BMI 43.46 kg/m   Physical Exam Constitutional:      General: She is not in acute distress.    Appearance: Normal appearance. She is well-developed.  HENT:     Head: Normocephalic and atraumatic.  Eyes:     General: No scleral icterus.    Conjunctiva/sclera: Conjunctivae normal.  Cardiovascular:     Rate and Rhythm: Normal rate and regular rhythm.     Heart sounds: No murmur heard.  No friction rub. No gallop.   Pulmonary:     Effort: Pulmonary effort is normal. No respiratory distress.     Breath sounds: Normal breath sounds. No wheezing or rales.  Abdominal:     General: Bowel sounds are normal. There is no distension.     Palpations: Abdomen is soft. There is no mass.     Tenderness: There is no abdominal tenderness. There is no guarding or rebound.  Musculoskeletal:        General: Normal range of motion.     Cervical back: Normal range of motion and neck supple.  Neurological:     General: No focal deficit present.     Mental Status: She is alert and oriented to person, place, and time.     Cranial Nerves: No cranial nerve deficit.  Skin:    General: Skin is warm and dry.     Findings: No erythema.  Psychiatric:        Mood and Affect: Mood normal.        Behavior: Behavior normal.        Judgment: Judgment normal.     NST Baseline FHR: 135 beats/min   Variability: moderate   Accelerations: present   Decelerations: absent Contractions: not assessed  Overall assessment: cat 1  No results found for: SARSCOV2NAA  Assessment:  Tammy Chase is a 34 y.o. G67P1001 female at [redacted]w[redacted]d with history of c-section, desires repeat.   Plan:  1. Admit to Labor &  Delivery  2. CBC, T&S, NPOI, IVF 3. GBS positive.   4. Fetwal well-being:  reassuring today 5. Consents reviewed and signed to OR for repeat c-section  Return in about 1 week (around 10/06/2019) for Post op incision check.    Thomasene Mohair, MD 09/29/2019 8:19 AM

## 2019-09-29 NOTE — Progress Notes (Signed)
OB History & Physical   History of Present Illness:  Chief Complaint: Here for repeat c-section  HPI:  Naydelin Ziegler is a 34 y.o. G55P1001 female at [redacted]w[redacted]d dated by embryo transfer.  Her pregnancy has been complicated by pregnancy through ART (S/p fetal echo), history of c-section, obesity in pregnancy, rh negative status, GBS positive.    She denies contractions.   She denies leakage of fluid.   She denies vaginal bleeding.   She reports fetal movement.    Total weight gain for pregnancy: 4 lb (1.814 kg)   Obstetrical Problem List: Pregnancy#2 Problems (from 01/03/19 to present)    Problem Noted Resolved   Positive GBS test 09/09/2019 by Vena Austria, MD No   Rh negative state in antepartum period 03/12/2019 by Conard Novak, MD No   Overview Signed 03/12/2019  9:23 AM by Conard Novak, MD    [ ]  Rhogam per protocol      Nausea and vomiting during pregnancy 03/12/2019 by 05/10/2019, MD No   Supervision of high risk pregnancy, antepartum 02/27/2019 by 03/01/2019, MD No   Overview Addendum 08/19/2019  3:59 PM by 08/21/2019, MD    Clinic [facility] Prenatal Labs  Dating Day 5 embryo xfer, 7wk Natale Milch Blood type: A/Negative/-- (01/28 0916)   Genetic Screen AFP:          NIPS: diploid XX Antibody:Negative (01/28 0916)  Anatomic 03-09-1987 complete Rubella: 3.52 (01/28 0916)  Varicella: Immune  GTT Early: 115 Third trimester: 152 3hr WNL RPR: Non Reactive (01/28 0916)   Rhogam  07/14/2019 HBsAg: Negative (01/28 0916)   TDaP vaccine   07/31/2019  Flu Shot: HIV: Non Reactive (01/28 0916)   Baby Food    Breast                            GBS:   Contraception  Undecided Pap: 2021 NIL  CBB     CS/VBAC [ ]  Desires TOLAC    Support Person  Husband: 2022           Previous Version   Pregnancy conceived through in vitro fertilization 02/27/2019 by Feliz Beam, MD No   Overview Signed 02/27/2019 11:47 AM by Conard Novak, MD    []  fetal echo at 22 weeks -  declined PGS - day 5 embryo transfer - on estrogen patches and vaginal progesterone until 10 weeks per [facility].      Obesity affecting pregnancy 02/27/2019 by Conard Novak, MD No   Overview Addendum 03/12/2019  9:23 AM by 03/01/2019, MD    [x]  early 1h gtt - 115      Previous Version   BMI 40.0-44.9, adult (HCC) 02/27/2019 by Conard Novak, MD No   History of cesarean delivery affecting pregnancy 02/27/2019 by 11-19-1981, MD No   Overview Signed 02/27/2019 11:48 AM by Conard Novak, MD    [ ]  decide of TOLAC          Maternal Medical History:   Past Medical History:  Diagnosis Date  . Obesity affecting pregnancy   . PCOS (polycystic ovarian syndrome)     Past Surgical History:  Procedure Laterality Date  . CESAREAN SECTION N/A 09/25/2014   Procedure: CESAREAN SECTION;  Surgeon: Conard Novak, MD;  Location: [facility] ORS;  Service: Obstetrics;  Laterality: N/A;    No Known Allergies  Prior to Admission medications  Medication Sig Start Date End Date Taking? Authorizing Provider  Prenatal Vit-Fe Fumarate-FA (MULTIVITAMIN-PRENATAL) 27-0.8 MG TABS tablet Take 1 tablet by mouth every evening.    Yes [provider]    OB History  Gravida Para Term Preterm AB Living  2 1 1     1  SAB TAB Ectopic Multiple Live Births        0 1    # Outcome Date GA Lbr Len/2nd Weight Sex Delivery Anes PTL Lv  2 Current           1 Term 09/25/14 [redacted]w[redacted]d  7 lb 5.1 oz (3.32 kg) F CS-LTranv EPI  LIV    Prenatal care site: [facility]  Social History: She  reports that she has never smoked. She has never used smokeless tobacco. She reports previous alcohol use. She reports that she does not use drugs.  Family History: family history includes Cancer in her brother and maternal grandfather; Diabetes in her maternal grandmother; Stroke in her maternal grandmother.   Review of Systems:  Review of Systems  Constitutional: Negative.   HENT: Negative.    Eyes: Negative.   Respiratory: Negative.   Cardiovascular: Negative.   Gastrointestinal: Negative.   Genitourinary: Negative.   Musculoskeletal: Negative.   Skin: Negative.   Neurological: Negative.   Psychiatric/Behavioral: Negative.      Physical Exam:  BP 120/72   Wt 230 lb (104.3 kg)   LMP 01/03/2019   BMI 43.46 kg/m   Physical Exam Constitutional:      General: She is not in acute distress.    Appearance: Normal appearance. She is well-developed.  HENT:     Head: Normocephalic and atraumatic.  Eyes:     General: No scleral icterus.    Conjunctiva/sclera: Conjunctivae normal.  Cardiovascular:     Rate and Rhythm: Normal rate and regular rhythm.     Heart sounds: No murmur heard.  No friction rub. No gallop.   Pulmonary:     Effort: Pulmonary effort is normal. No respiratory distress.     Breath sounds: Normal breath sounds. No wheezing or rales.  Abdominal:     General: Bowel sounds are normal. There is no distension.     Palpations: Abdomen is soft. There is no mass.     Tenderness: There is no abdominal tenderness. There is no guarding or rebound.  Musculoskeletal:        General: Normal range of motion.     Cervical back: Normal range of motion and neck supple.  Neurological:     General: No focal deficit present.     Mental Status: She is alert and oriented to person, place, and time.     Cranial Nerves: No cranial nerve deficit.  Skin:    General: Skin is warm and dry.     Findings: No erythema.  Psychiatric:        Mood and Affect: Mood normal.        Behavior: Behavior normal.        Judgment: Judgment normal.     NST Baseline FHR: 135 beats/min   Variability: moderate   Accelerations: present   Decelerations: absent Contractions: not assessed  Overall assessment: cat 1  No results found for: SARSCOV2NAA  Assessment:  Boneta Ames is a 34 y.o. G2P1001 female at [redacted]w[redacted]d with history of c-section, desires repeat.   Plan:  1. Admit to Labor &  Delivery  2. CBC, T&S, NPOI, IVF 3. GBS positive.   4. Fetwal well-being:   reassuring today 5. Consents reviewed and signed to OR for repeat c-section  Return in about 1 week (around 10/06/2019) for Post op incision check.    Thomasene Mohair, MD 09/29/2019 8:19 AM

## 2019-09-29 NOTE — Patient Instructions (Addendum)
Arrival Time: Please call Labor and Delivery the day before your scheduled C-Section to find out your arrival time. [phone].  Arrival: Arrive to the [facility] If your arrival time is prior to 6:00am, please call Labor and Delivery [phone] from your cell phone upon arrival and someone from L&D or security will come down to the [facility] entrance and escort you to Labor and Delivery. If your arrival time is 6:00am or later, please enter the Medical Mall and follow the greeter's instructions.  REMEMBER: Instructions that are not followed completely may result in serious medical risk, up to and including death; or upon the discretion of your surgeon and anesthesiologist your surgery may need to be rescheduled.  Do not eat food after midnight the night before surgery.  No gum chewing, lozengers or hard candies.  You may however, drink CLEAR liquids up to 2 hours before you are scheduled to arrive for your surgery. Do not drink anything within 2 hours of your scheduled arrival time.  Clear liquids include: - water  - apple juice without pulp - gatorade (not RED) - black coffee or tea (Do NOT add milk or creamers to the coffee or tea) Do NOT drink anything that is not on this list.  DO NOT TAKE ANY MEDICATIONS THE MORNING OF SURGERY  Stop Anti-inflammatories (NSAIDS) such as Advil, Aleve, Ibuprofen, Motrin, Naproxen, Naprosyn and Aspirin based products such as Excedrin, Goodys Powder, BC Powder. (May take Tylenol or Acetaminophen if needed.)  Stop ANY OVER THE COUNTER supplements until after surgery. (May continue multivitamin.)  No Alcohol for 24 hours before or after surgery.  On the morning of surgery brush your teeth with toothpaste and water, you may rinse your mouth with mouthwash if you wish. Do not swallow any toothpaste or mouthwash.  Do not wear jewelry, make-up, hairpins, clips or nail polish.  Do not wear lotions, powders, or perfumes.   Do not shave 48  hours prior to surgery.   Do not bring valuables to the hospital. [facility] is not responsible for any missing/lost belongings or valuables.   Use CHG wipes as directed on instruction sheet.  Notify your doctor if there is any change in your medical condition (cold, fever, infection).  Plan for stool softeners for home use; pain medications have a tendency to cause constipation. You can also help prevent constipation by eating foods high in fiber such as fruits and vegetables and drinking plenty of fluids as your diet allows.  After surgery, you can help prevent lung complications by doing breathing exercises.  Take deep breaths and cough every 1-2 hours. Your doctor may order a device called an Incentive Spirometer to help you take deep breaths. When coughing or sneezing, hold a pillow firmly against your incision with both hands. This is called "splinting." Doing this helps protect your incision. It also decreases belly discomfort.  Please call the Pre-admissions Testing Dept. at [phone] if you have any questions about these instructions.  Visitation Policy:  Patients undergoing a surgery or procedure may have one family member or support person with them as long as that person is not COVID-19 positive or experiencing its symptoms.  That person may remain in the waiting area during the procedure.  Inpatient Visitation Update:   In an effort to ensure the safety of our team members and our patients, we are implementing a change to our visitation policy:  Effective Monday, Aug. 9, at 7 a.m., inpatients will be allowed one support person.  The support person may change daily.  The support person must pass our screening, gel in and out, and wear a mask at all times, including in the patient's room.  Patients must also wear a mask when staff or their support person are in the room.  Masking is required regardless of vaccination status.  Systemwide, no visitors 17 or  younger.

## 2019-09-30 ENCOUNTER — Other Ambulatory Visit: Payer: Self-pay

## 2019-09-30 ENCOUNTER — Inpatient Hospital Stay: Payer: BC Managed Care – PPO | Admitting: Anesthesiology

## 2019-09-30 ENCOUNTER — Encounter: Payer: Self-pay | Admitting: Obstetrics and Gynecology

## 2019-09-30 ENCOUNTER — Inpatient Hospital Stay: Admission: RE | Admit: 2019-09-30 | Payer: BC Managed Care – PPO | Source: Ambulatory Visit

## 2019-09-30 ENCOUNTER — Encounter
Admission: RE | Disposition: A | Discharge status: Home / Self Care | Payer: Self-pay | Source: Home / Self Care | Attending: Obstetrics and Gynecology

## 2019-09-30 ENCOUNTER — Inpatient Hospital Stay
Admission: RE | Admit: 2019-09-30 | Discharge: 2019-10-01 | DRG: 787 | Disposition: A | Discharge status: Home / Self Care | Payer: BC Managed Care – PPO | Attending: Obstetrics and Gynecology | Admitting: Obstetrics and Gynecology

## 2019-09-30 DIAGNOSIS — Z01812 Encounter for preprocedural laboratory examination: Secondary | ICD-10-CM

## 2019-09-30 DIAGNOSIS — D62 Acute posthemorrhagic anemia: Secondary | ICD-10-CM | POA: Diagnosis not present

## 2019-09-30 DIAGNOSIS — Z6791 Unspecified blood type, Rh negative: Secondary | ICD-10-CM

## 2019-09-30 DIAGNOSIS — O26893 Other specified pregnancy related conditions, third trimester: Secondary | ICD-10-CM | POA: Diagnosis present

## 2019-09-30 DIAGNOSIS — O99214 Obesity complicating childbirth: Secondary | ICD-10-CM | POA: Diagnosis present

## 2019-09-30 DIAGNOSIS — Z3A39 39 weeks gestation of pregnancy: Secondary | ICD-10-CM | POA: Diagnosis not present

## 2019-09-30 DIAGNOSIS — O99213 Obesity complicating pregnancy, third trimester: Secondary | ICD-10-CM

## 2019-09-30 DIAGNOSIS — O99820 Streptococcus B carrier state complicating pregnancy: Secondary | ICD-10-CM

## 2019-09-30 DIAGNOSIS — O9081 Anemia of the puerperium: Secondary | ICD-10-CM | POA: Diagnosis not present

## 2019-09-30 DIAGNOSIS — O219 Vomiting of pregnancy, unspecified: Secondary | ICD-10-CM

## 2019-09-30 DIAGNOSIS — O0990 Supervision of high risk pregnancy, unspecified, unspecified trimester: Secondary | ICD-10-CM

## 2019-09-30 DIAGNOSIS — O99824 Streptococcus B carrier state complicating childbirth: Secondary | ICD-10-CM | POA: Diagnosis present

## 2019-09-30 DIAGNOSIS — O34211 Maternal care for low transverse scar from previous cesarean delivery: Principal | ICD-10-CM | POA: Diagnosis present

## 2019-09-30 DIAGNOSIS — Z98891 History of uterine scar from previous surgery: Secondary | ICD-10-CM

## 2019-09-30 DIAGNOSIS — O09813 Supervision of pregnancy resulting from assisted reproductive technology, third trimester: Secondary | ICD-10-CM

## 2019-09-30 DIAGNOSIS — B951 Streptococcus, group B, as the cause of diseases classified elsewhere: Secondary | ICD-10-CM

## 2019-09-30 DIAGNOSIS — O34219 Maternal care for unspecified type scar from previous cesarean delivery: Secondary | ICD-10-CM

## 2019-09-30 DIAGNOSIS — O99210 Obesity complicating pregnancy, unspecified trimester: Secondary | ICD-10-CM | POA: Diagnosis present

## 2019-09-30 DIAGNOSIS — Z6841 Body Mass Index (BMI) 40.0 and over, adult: Secondary | ICD-10-CM

## 2019-09-30 DIAGNOSIS — O26899 Other specified pregnancy related conditions, unspecified trimester: Secondary | ICD-10-CM

## 2019-09-30 DIAGNOSIS — Z20822 Contact with and (suspected) exposure to covid-19: Secondary | ICD-10-CM | POA: Diagnosis present

## 2019-09-30 DIAGNOSIS — E669 Obesity, unspecified: Secondary | ICD-10-CM

## 2019-09-30 DIAGNOSIS — O09819 Supervision of pregnancy resulting from assisted reproductive technology, unspecified trimester: Secondary | ICD-10-CM

## 2019-09-30 LAB — RPR: RPR Ser Ql: NONREACTIVE

## 2019-09-30 SURGERY — Surgical Case
Anesthesia: Spinal

## 2019-09-30 MED ORDER — SOD CITRATE-CITRIC ACID 500-334 MG/5ML PO SOLN
ORAL | Status: AC
  Filled 2019-09-30: qty 30

## 2019-09-30 MED ORDER — DEXAMETHASONE SODIUM PHOSPHATE 4 MG/ML IJ SOLN
INTRAMUSCULAR | Status: DC | PRN
  Administered 2019-09-30: 10 mg via INTRAVENOUS

## 2019-09-30 MED ORDER — NALBUPHINE HCL 10 MG/ML IJ SOLN: 5.0000 mg | INTRAMUSCULAR | Status: DC | PRN

## 2019-09-30 MED ORDER — MEPERIDINE HCL 25 MG/ML IJ SOLN: 6.2500 mg | INTRAMUSCULAR | Status: DC | PRN

## 2019-09-30 MED ORDER — DIPHENHYDRAMINE HCL 50 MG/ML IJ SOLN: 12.5000 mg | INTRAMUSCULAR | Status: DC | PRN

## 2019-09-30 MED ORDER — CHLORHEXIDINE GLUCONATE 0.12 % MT SOLN
15.0000 mL | Freq: Once | OROMUCOSAL | Status: AC
  Administered 2019-09-30: 15 mL via OROMUCOSAL
  Filled 2019-09-30: qty 15

## 2019-09-30 MED ORDER — FERROUS SULFATE 325 (65 FE) MG PO TABS
325.0000 mg | ORAL_TABLET | Freq: Two times a day (BID) | ORAL | Status: DC
  Administered 2019-09-30 – 2019-10-01 (×2): 325 mg via ORAL
  Filled 2019-09-30 (×2): qty 1

## 2019-09-30 MED ORDER — SIMETHICONE 80 MG PO CHEW
80.0000 mg | CHEWABLE_TABLET | Freq: Three times a day (TID) | ORAL | Status: DC
  Administered 2019-09-30 – 2019-10-01 (×3): 80 mg via ORAL
  Filled 2019-09-30 (×3): qty 1

## 2019-09-30 MED ORDER — FENTANYL CITRATE (PF) 100 MCG/2ML IJ SOLN
INTRAMUSCULAR | Status: DC | PRN
Start: 2019-09-30 — End: 2019-09-30
  Administered 2019-09-30: 15 ug via INTRATHECAL

## 2019-09-30 MED ORDER — ORAL CARE MOUTH RINSE: 15.0000 mL | Freq: Once | OROMUCOSAL | Status: AC

## 2019-09-30 MED ORDER — OXYCODONE-ACETAMINOPHEN 5-325 MG PO TABS: 2.0000 | ORAL_TABLET | ORAL | Status: DC | PRN

## 2019-09-30 MED ORDER — OXYCODONE-ACETAMINOPHEN 5-325 MG PO TABS: 1.0000 | ORAL_TABLET | ORAL | Status: DC | PRN

## 2019-09-30 MED ORDER — OXYTOCIN-SODIUM CHLORIDE 30-0.9 UT/500ML-% IV SOLN
INTRAVENOUS | Status: DC | PRN
  Administered 2019-09-30: 250 mL via INTRAVENOUS

## 2019-09-30 MED ORDER — LACTATED RINGERS IV SOLN: INTRAVENOUS | Status: DC

## 2019-09-30 MED ORDER — DIPHENHYDRAMINE HCL 25 MG PO CAPS: 25.0000 mg | ORAL_CAPSULE | Freq: Four times a day (QID) | ORAL | Status: DC | PRN

## 2019-09-30 MED ORDER — SENNOSIDES-DOCUSATE SODIUM 8.6-50 MG PO TABS
2.0000 | ORAL_TABLET | ORAL | Status: DC
  Administered 2019-09-30: 2 via ORAL
  Filled 2019-09-30: qty 2

## 2019-09-30 MED ORDER — ENOXAPARIN SODIUM 40 MG/0.4ML ~~LOC~~ SOLN
40.0000 mg | SUBCUTANEOUS | Status: DC
  Filled 2019-09-30 (×2): qty 0.4

## 2019-09-30 MED ORDER — MORPHINE SULFATE (PF) 0.5 MG/ML IJ SOLN
INTRAMUSCULAR | Status: AC
Start: 2019-09-30 — End: ?
  Filled 2019-09-30: qty 10

## 2019-09-30 MED ORDER — NALOXONE HCL 0.4 MG/ML IJ SOLN: 0.4000 mg | INTRAMUSCULAR | Status: DC | PRN

## 2019-09-30 MED ORDER — ONDANSETRON HCL 4 MG/2ML IJ SOLN: 4.0000 mg | Freq: Three times a day (TID) | INTRAMUSCULAR | Status: DC | PRN

## 2019-09-30 MED ORDER — MORPHINE SULFATE (PF) 0.5 MG/ML IJ SOLN
INTRAMUSCULAR | Status: DC | PRN
Start: 2019-09-30 — End: 2019-09-30
  Administered 2019-09-30: .1 mg via INTRATHECAL

## 2019-09-30 MED ORDER — BUPIVACAINE HCL (PF) 0.5 % IJ SOLN
5.0000 mL | Freq: Once | INTRAMUSCULAR | Status: DC
  Filled 2019-09-30: qty 30

## 2019-09-30 MED ORDER — KETOROLAC TROMETHAMINE 30 MG/ML IJ SOLN: 30.0000 mg | Freq: Four times a day (QID) | INTRAMUSCULAR | Status: DC | PRN

## 2019-09-30 MED ORDER — FENTANYL CITRATE (PF) 100 MCG/2ML IJ SOLN
INTRAMUSCULAR | Status: DC | PRN
Start: 2019-09-30 — End: 2019-09-30

## 2019-09-30 MED ORDER — NALBUPHINE HCL 10 MG/ML IJ SOLN: 5.0000 mg | Freq: Once | INTRAMUSCULAR | Status: DC | PRN

## 2019-09-30 MED ORDER — BUPIVACAINE IN DEXTROSE 0.75-8.25 % IT SOLN
INTRATHECAL | Status: DC | PRN
  Administered 2019-09-30: 1.6 mL via INTRATHECAL

## 2019-09-30 MED ORDER — KETOROLAC TROMETHAMINE 30 MG/ML IJ SOLN
30.0000 mg | Freq: Four times a day (QID) | INTRAMUSCULAR | Status: DC | PRN
  Administered 2019-09-30 (×2): 30 mg via INTRAVENOUS
  Filled 2019-09-30 (×2): qty 1

## 2019-09-30 MED ORDER — SODIUM CHLORIDE 0.9% FLUSH: 3.0000 mL | INTRAVENOUS | Status: DC | PRN

## 2019-09-30 MED ORDER — COCONUT OIL OIL
1.0000 "application " | TOPICAL_OIL | Status: DC | PRN
  Administered 2019-09-30: 1 via TOPICAL
  Filled 2019-09-30: qty 120

## 2019-09-30 MED ORDER — BUPIVACAINE 0.25 % ON-Q PUMP DUAL CATH 400 ML
400.0000 mL | INJECTION | Status: DC
  Filled 2019-09-30: qty 400

## 2019-09-30 MED ORDER — FENTANYL CITRATE (PF) 100 MCG/2ML IJ SOLN
INTRAMUSCULAR | Status: AC
  Filled 2019-09-30: qty 2

## 2019-09-30 MED ORDER — IBUPROFEN 600 MG PO TABS: 600.0000 mg | ORAL_TABLET | Freq: Four times a day (QID) | ORAL | Status: DC

## 2019-09-30 MED ORDER — OXYTOCIN-SODIUM CHLORIDE 30-0.9 UT/500ML-% IV SOLN
INTRAVENOUS | Status: AC
  Filled 2019-09-30: qty 500

## 2019-09-30 MED ORDER — OXYTOCIN-SODIUM CHLORIDE 30-0.9 UT/500ML-% IV SOLN
2.5000 [IU]/h | INTRAVENOUS | Status: DC
  Administered 2019-09-30 (×2): 2.5 [IU]/h via INTRAVENOUS
  Filled 2019-09-30: qty 500

## 2019-09-30 MED ORDER — EPHEDRINE SULFATE 50 MG/ML IJ SOLN
INTRAMUSCULAR | Status: DC | PRN
  Administered 2019-09-30: 10 mg via INTRAVENOUS
  Administered 2019-09-30: 5 mg via INTRAVENOUS
  Administered 2019-09-30: 15 mg via INTRAVENOUS

## 2019-09-30 MED ORDER — SODIUM CHLORIDE 0.9 % IV SOLN
INTRAVENOUS | Status: DC | PRN
  Administered 2019-09-30: 50 ug/min via INTRAVENOUS

## 2019-09-30 MED ORDER — NALOXONE HCL 4 MG/10ML IJ SOLN
1.0000 ug/kg/h | INTRAVENOUS | Status: DC | PRN
  Filled 2019-09-30: qty 5

## 2019-09-30 MED ORDER — BUPIVACAINE HCL (PF) 0.5 % IJ SOLN: 5.0000 mL | Freq: Once | INTRAMUSCULAR | Status: DC

## 2019-09-30 MED ORDER — SOD CITRATE-CITRIC ACID 500-334 MG/5ML PO SOLN
30.0000 mL | ORAL | Status: AC
  Administered 2019-09-30: 30 mL via ORAL

## 2019-09-30 MED ORDER — WITCH HAZEL-GLYCERIN EX PADS: 1.0000 "application " | MEDICATED_PAD | CUTANEOUS | Status: DC | PRN

## 2019-09-30 MED ORDER — ONDANSETRON HCL 4 MG/2ML IJ SOLN: 4.0000 mg | Freq: Once | INTRAMUSCULAR | Status: DC | PRN

## 2019-09-30 MED ORDER — ACETAMINOPHEN 500 MG PO TABS
1000.0000 mg | ORAL_TABLET | Freq: Four times a day (QID) | ORAL | Status: DC
  Administered 2019-09-30: 1000 mg via ORAL
  Filled 2019-09-30: qty 2

## 2019-09-30 MED ORDER — MENTHOL 3 MG MT LOZG
1.0000 | LOZENGE | OROMUCOSAL | Status: DC | PRN
  Filled 2019-09-30: qty 9

## 2019-09-30 MED ORDER — CEFAZOLIN SODIUM-DEXTROSE 2-4 GM/100ML-% IV SOLN
2.0000 g | INTRAVENOUS | Status: AC
  Administered 2019-09-30: 2 g via INTRAVENOUS
  Filled 2019-09-30: qty 100

## 2019-09-30 MED ORDER — FENTANYL CITRATE (PF) 100 MCG/2ML IJ SOLN: 25.0000 ug | INTRAMUSCULAR | Status: DC | PRN

## 2019-09-30 MED ORDER — ONDANSETRON HCL 4 MG/2ML IJ SOLN
INTRAMUSCULAR | Status: DC | PRN
  Administered 2019-09-30: 4 mg via INTRAVENOUS

## 2019-09-30 MED ORDER — PRENATAL MULTIVITAMIN CH
1.0000 | ORAL_TABLET | Freq: Every day | ORAL | Status: DC
  Administered 2019-09-30 – 2019-10-01 (×2): 1 via ORAL
  Filled 2019-09-30 (×2): qty 1

## 2019-09-30 MED ORDER — DIPHENHYDRAMINE HCL 25 MG PO CAPS: 25.0000 mg | ORAL_CAPSULE | ORAL | Status: DC | PRN

## 2019-09-30 MED ORDER — DIBUCAINE (PERIANAL) 1 % EX OINT: 1.0000 "application " | TOPICAL_OINTMENT | CUTANEOUS | Status: DC | PRN

## 2019-09-30 MED ORDER — ACETAMINOPHEN 500 MG PO TABS
1000.0000 mg | ORAL_TABLET | Freq: Four times a day (QID) | ORAL | Status: AC
  Administered 2019-09-30 – 2019-10-01 (×3): 1000 mg via ORAL
  Filled 2019-09-30 (×3): qty 2

## 2019-09-30 SURGICAL SUPPLY — 38 items
BARRIER ADHS 3X4 INTERCEED (GAUZE/BANDAGES/DRESSINGS) ×3 IMPLANT
CANISTER SUCT 3000ML PPV (MISCELLANEOUS) ×3 IMPLANT
CATH KIT ON-Q SILVERSOAK 5IN (CATHETERS) ×6 IMPLANT
CLOSURE WOUND 1/2 X4 (GAUZE/BANDAGES/DRESSINGS) ×1
COVER WAND RF STERILE (DRAPES) ×3 IMPLANT
DERMABOND ADHESIVE PROPEN (GAUZE/BANDAGES/DRESSINGS) ×2
DERMABOND ADVANCED (GAUZE/BANDAGES/DRESSINGS) ×4
DERMABOND ADVANCED .7 DNX12 (GAUZE/BANDAGES/DRESSINGS) ×2 IMPLANT
DERMABOND ADVANCED .7 DNX6 (GAUZE/BANDAGES/DRESSINGS) ×1 IMPLANT
DRSG OPSITE POSTOP 4X10 (GAUZE/BANDAGES/DRESSINGS) ×3 IMPLANT
DRSG TELFA 3X8 NADH (GAUZE/BANDAGES/DRESSINGS) ×3 IMPLANT
ELECT CAUTERY BLADE 6.4 (BLADE) ×3 IMPLANT
ELECT REM PT RETURN 9FT ADLT (ELECTROSURGICAL) ×3
ELECTRODE REM PT RTRN 9FT ADLT (ELECTROSURGICAL) ×1 IMPLANT
EXTRACTOR VACUUM KIWI (MISCELLANEOUS) ×3 IMPLANT
GAUZE SPONGE 4X4 12PLY STRL (GAUZE/BANDAGES/DRESSINGS) ×3 IMPLANT
GLOVE BIO SURGEON STRL SZ7 (GLOVE) ×3 IMPLANT
GLOVE INDICATOR 7.5 STRL GRN (GLOVE) ×3 IMPLANT
GOWN STRL REUS W/ TWL LRG LVL3 (GOWN DISPOSABLE) ×3 IMPLANT
GOWN STRL REUS W/TWL LRG LVL3 (GOWN DISPOSABLE) ×6
NS IRRIG 1000ML POUR BTL (IV SOLUTION) ×3 IMPLANT
PACK C SECTION (MISCELLANEOUS) ×3 IMPLANT
PAD OB MATERNITY 4.3X12.25 (PERSONAL CARE ITEMS) ×6 IMPLANT
PAD PREP 24X41 OB/GYN DISP (PERSONAL CARE ITEMS) ×3 IMPLANT
PENCIL SMOKE ULTRAEVAC 22 CON (MISCELLANEOUS) ×3 IMPLANT
RETRACTOR TRAXI PANNICULUS (MISCELLANEOUS) ×1 IMPLANT
STRIP CLOSURE SKIN 1/2X4 (GAUZE/BANDAGES/DRESSINGS) ×2 IMPLANT
SUT MNCRL 4-0 (SUTURE) ×2
SUT MNCRL 4-0 27XMFL (SUTURE) ×1
SUT PDS AB 1 TP1 96 (SUTURE) ×3 IMPLANT
SUT PLAIN 2 0 XLH (SUTURE) ×3 IMPLANT
SUT PLAIN GUT 0 (SUTURE) IMPLANT
SUT VIC AB 0 CTX 36 (SUTURE) ×4
SUT VIC AB 0 CTX36XBRD ANBCTRL (SUTURE) ×2 IMPLANT
SUT VICRYL+ 3-0 36IN CT-1 (SUTURE) ×6 IMPLANT
SUTURE MNCRL 4-0 27XMF (SUTURE) ×1 IMPLANT
SWABSTK COMLB BENZOIN TINCTURE (MISCELLANEOUS) ×3 IMPLANT
TRAXI PANNICULUS RETRACTOR (MISCELLANEOUS) ×2

## 2019-09-30 NOTE — Interval H&P Note (Signed)
History and Physical Interval Note:  09/30/2019 7:19 AM  Tammy Chase  has presented today for surgery, with the diagnosis of History of prior cesarean section, 39 weeks.  The various methods of treatment have been discussed with the patient and family. After consideration of risks, benefits and other options for treatment, the patient has consented to  Procedure(s): CESAREAN SECTION (N/A) as a surgical intervention.  The patient's history has been reviewed, patient examined, no change in status, stable for surgery.  I have reviewed the patient's chart and labs.  Questions were answered to the patient's satisfaction.  Consents reviewed.  Today patient notes +FM, no LOF, no vaginal bleeding, and no contractions.  Thomasene Mohair, MD, [facility], [facility] 09/30/2019 7:19 AM

## 2019-09-30 NOTE — Anesthesia Procedure Notes (Signed)
Spinal  Start time: 09/30/2019 7:40 AM End time: 09/30/2019 7:44 AM Staffing Performed: resident/CRNA  Anesthesiologist: Yevette Edwards, MD Resident/CRNA: Irving Burton, CRNA Preanesthetic Checklist Completed: patient identified, IV checked, site marked, risks and benefits discussed, surgical consent, monitors and equipment checked and pre-op evaluation Spinal Block Patient position: sitting Prep: ChloraPrep and site prepped and draped Patient monitoring: heart rate, continuous pulse ox and blood pressure Approach: midline Location: L3-4 Injection technique: single-shot Needle Needle type: Pencan  Needle gauge: 24 G

## 2019-09-30 NOTE — Op Note (Signed)
Cesarean Section Operative Note    Patient Name: Tammy Chase  MRN: [id number]  Date of Surgery: 09/30/2019   Pre-operative Diagnosis:  1) History of cesarean delivery, desires repeat. 2) Intrauterine pregnancy at [redacted]w[redacted]d   Post-operative Diagnosis:  1) History of cesarean delivery, desires repeat. 2) Intrauterine pregnancy at [redacted]w[redacted]d    Procedure: Repeat Low Transverse Cesarean Section via Pfannenstiel Incision with double layer uterine closure  Surgeon: Surgeon(s) and Role:    Conard Novak, MD - Primary  Assistants: Dr. Annamarie Major; No other capable assistant available, in surgery requiring high level assistant.  Anesthesia: spinal   Findings:  1) normal appearing gravid uterus and ovaries 2) Dense adhesions of anterior abdominal wall to mid-uterus.   3) mild adhesions of left fallopian tubes to antero-lateral uterus 4) Viable female infant with APGARs 9 and 9, weight 3,480 grams   Quantified Blood Loss: 598 mL  Total IV Fluids: 1,000 ml   Urine Output: 150 mL clear urine at end of procedure  Specimens: none  Complications: no complications  Disposition: PACU - hemodynamically stable.   Maternal Condition: stable   Baby condition / location:  Couplet care / Skin to Skin  Procedure Details:  The patient was seen in the Holding Room. The risks, benefits, complications, treatment options, and expected outcomes were discussed with the patient. The patient concurred with the proposed plan, giving informed consent. identified as Tammy Chase and the procedure verified as C-Section Delivery. A Time Out was held and the above information confirmed.   After induction of anesthesia, the patient was draped and prepped in the usual sterile manner. A Pfannenstiel incision was made and carried down through the subcutaneous tissue to the fascia. Fascial incision was made and extended transversely. The fascia was separated from the underlying rectus tissue superiorly and  inferiorly. The peritoneum was identified and entered. Peritoneum was entered bluntly and noted to be densely adherent to anterior uterus.  A thick band was identified and double clamped with Kelly clamps and sharply divided between.  A transfixation stitch was used to tie both free ends.  Hemostasis was noted.  This allowed adequate visualization of the lower uterine segment.   The rectus muscles were divided for about 1 cm in a medial to lateral fashion to increase space.  The bladder flap was not bluntly or sharply freed from the lower uterine segment. A low transverse uterine incision was made and the hysterotomy was extended with cranial-caudal tension.  A [place] vacuum was utilized to deliver the fetal head after fundal pressure was not enough. Correct placement of the vacuum was confirmed with no intervening tissue.  Correct pressure in the "green" range on the pressure indicator was verified prior to gently traction being applied to the vacuum.  The head was delivered and the vacuum was removed.  The rest of the body delivered without difficulty.  The infant was a 3,480 gram Living newborn infant(s) or Female with Apgar scores of 9 at one minute and 9 at five minutes. Cord ph was not sent the umbilical cord was clamped and cut cord blood was obtained for evaluation. The placenta was removed Intact and appeared normal. The uterine outline, tubes and ovaries appeared as noted above. The uterine incision was closed with running locked sutures of 0 Vicryl.  A second layer of the same suture was thrown in an imbricating fashion.  Hemostasis was assured.  The uterus was returned to the abdomen and the paracolic gutters were cleared of all  clots and debris.  An adhesion barrier was placed over the area of prior adhesion. The rectus muscles were inspected and found to be hemostatic.  The fascia was then reapproximated with running sutures of 1-0 PDS, looped. The subcutaneous tissue was reapproximated using 2-0  plain gut such that no greater than 2cm of dead space remained. The subcuticular closure was performed using 4-0 monocryl. The skin closure was reinforced using surgical skin glue.  The surgical assistant performed tissue retraction, assistance with suturing, and fundal pressure.    Instrument, sponge, and needle counts were correct prior the abdominal closure and were correct at the conclusion of the case.  The patient received Ancef 2 gram IV prior to skin incision (within 30 minutes). For VTE prophylaxis she was wearing SCDs throughout the case.  The assistant surgeon was an MD due to lack of availability of another Sales promotion account executive.   Signed: Conard Novak, MD 09/30/2019 9:13 AM

## 2019-09-30 NOTE — Discharge Summary (Signed)
Postpartum Discharge Summary  Patient Name: Tammy Chase DOB: [birth year] MRN: [id number]  Date of admission: 09/30/2019 Delivery date:09/30/2019  Delivering provider: Prentice Docker D  Date of discharge: 10/01/2019  Admitting diagnosis: History of cesarean delivery [Z98.891] Intrauterine pregnancy: [redacted]w[redacted]d    Secondary diagnosis:  Principal Problem:   Supervision of high risk pregnancy, antepartum Active Problems:   Pregnancy conceived through in vitro fertilization   Obesity affecting pregnancy   Rh negative state in antepartum period   History of cesarean delivery   Postpartum care following cesarean delivery   Single liveborn infant, delivered by cesarean   Encounter for care or examination of lactating mother  Additional problems: none    Discharge diagnosis: Term Pregnancy Delivered                                              Post partum procedures: none Augmentation: N/A Complications: None  Hospital course: Sceduled C/S   34y.o. yo G2P2002 at 36w6das admitted to the hospital 09/30/2019 for scheduled cesarean section with the following indication:Elective Repeat.Delivery details are as follows:  Membrane Rupture Time/Date: 8:25 AM ,09/30/2019   Delivery Method:C-Section, Vacuum Assisted  Details of operation can be found in separate operative note.  Patient had an uncomplicated postpartum course.  She is ambulating and tolerating a regular diet. She is voiding without difficulty.  She requests discharge home late in the day of day 1 and is discharged home in stable condition on  10/01/19.        Newborn Data: Birth date:09/30/2019  Birth time:8:26 AM  Gender:Female  Living status:Living  Apgars:9 ,9  Weight:3480 g     Magnesium Sulfate received: No BMZ received: No Rhophylac:No MMR:No T-DaP:Given prenatally on 07/31/19 Transfusion:No  Physical exam  Vitals:   09/30/19 2030 09/30/19 2100 09/30/19 2300 10/01/19 0821  BP:   (!) 96/54 (!) 100/51  Pulse: 81 90  73 74  Resp:   18 18  Temp:   98.5 F (36.9 C) 98 F (36.7 C)  TempSrc:   Oral Oral  SpO2: 98% 97% 98% 100%  Weight:      Height:       General: alert, cooperative and no distress Lochia: appropriate Uterine Fundus: firm Incision: Healing well with no significant drainage, Dressing is clean, dry, and intact DVT Evaluation: No evidence of DVT seen on physical exam. Labs: Lab Results  Component Value Date   WBC 15.6 (H) 10/01/2019   HGB 9.6 (L) 10/01/2019   HCT 29.1 (L) 10/01/2019   MCV 86.1 10/01/2019   PLT 151 10/01/2019   CMP Latest Ref Rng & Units 10/01/2019  Creatinine 0.44 - 1.00 mg/dL 0.87   Edinburgh Score: Edinburgh Postnatal Depression Scale Screening Tool 10/01/2019  I have been able to laugh and see the funny side of things. 1  I have looked forward with enjoyment to things. 2  I have blamed myself unnecessarily when things went wrong. 2  I have been anxious or worried for no good reason. 2  I have felt scared or panicky for no good reason. 2  Things have been getting on top of me. 2  I have been so unhappy that I have had difficulty sleeping. 1  I have felt sad or miserable. 1  I have been so unhappy that I have been crying. 1  The thought of harming  myself has occurred to me. 1  Edinburgh Postnatal Depression Scale Total 15   She has been seen by transition of care for elevated EPDS.   After visit meds:  Allergies as of 10/01/2019   No Known Allergies     Medication List    TAKE these medications   multivitamin-prenatal 27-0.8 MG Tabs tablet Take 1 tablet by mouth every evening.   oxyCODONE 5 MG immediate release tablet Commonly known as: Roxicodone Take 1 tablet (5 mg total) by mouth every 6 (six) hours as needed for up to 5 days for severe pain.            Discharge Care Instructions  (From admission, onward)         Start     Ordered   10/01/19 0000  Discharge wound care:       Comments: Keep incision dry, clean.   10/01/19 1443             Discharge home in stable condition Infant Feeding: Breast and formula Infant Disposition:home with mother Discharge instruction: per After Visit Summary and Postpartum booklet. Activity: Advance as tolerated. Pelvic rest for 6 weeks.  Diet: routine diet Anticipated Birth Control: Unsure Postpartum Appointment:1 week Additional Postpartum F/U: Incision check 1 week Future Appointments: Future Appointments  Date Time Provider [place]  10/07/2019  8:10 AM Will Bonnet, MD [facility] None   Follow up Visit:  Follow-up Information    Will Bonnet, MD. Go in 1 week(s).   Specialty: Obstetrics and Gynecology Why: incision check appointment  Contact information: [address] [place] [address] [phone]                  SIGNED: Christean Leaf, CNM [facility] 10/01/2019, 2:45 PM

## 2019-09-30 NOTE — Anesthesia Preprocedure Evaluation (Signed)
Anesthesia Evaluation  Patient identified by MRN, date of birth, ID band Patient awake    Reviewed: Allergy & Precautions, H&P , NPO status , Patient's Chart, lab work & pertinent test results, reviewed documented beta blocker date and time   Airway Mallampati: II   Neck ROM: full    Dental  (+) Poor Dentition   Pulmonary neg pulmonary ROS,    Pulmonary exam normal        Cardiovascular negative cardio ROS Normal cardiovascular exam Rhythm:regular Rate:Normal     Neuro/Psych negative neurological ROS  negative psych ROS   GI/Hepatic negative GI ROS, Neg liver ROS,   Endo/Other  negative endocrine ROS  Renal/GU negative Renal ROS  negative genitourinary   Musculoskeletal   Abdominal   Peds  Hematology  (+) Blood dyscrasia, anemia ,   Anesthesia Other Findings Past Medical History: No date: Anemia No date: Obesity affecting pregnancy No date: PCOS (polycystic ovarian syndrome) Past Surgical History: 09/25/2014: CESAREAN SECTION; N/A     Comment:  Procedure: CESAREAN SECTION;  Surgeon: Merrill Bing,               MD;  Location: [facility] ORS;  Service: Obstetrics;                Laterality: N/A; BMI    Body Mass Index: 43.27 kg/m     Reproductive/Obstetrics negative OB ROS                             Anesthesia Physical Anesthesia Plan  ASA: II  Anesthesia Plan: Spinal   Post-op Pain Management:    Induction:   PONV Risk Score and Plan: 3  Airway Management Planned:   Additional Equipment:   Intra-op Plan:   Post-operative Plan:   Informed Consent: I have reviewed the patients History and Physical, chart, labs and discussed the procedure including the risks, benefits and alternatives for the proposed anesthesia with the patient or authorized representative who has indicated his/her understanding and acceptance.     Dental Advisory Given  Plan Discussed with:  CRNA  Anesthesia Plan Comments:         Anesthesia Quick Evaluation

## 2019-09-30 NOTE — Plan of Care (Signed)
Patient delivered baby girl at 3. Patient in recovery and recovering well postpartum. Pain being managed and patient bonding well with baby. Plan to transfer to Mother Baby Unit after 2 hour recovery period.

## 2019-09-30 NOTE — Transfer of Care (Signed)
Immediate Anesthesia Transfer of Care Note  Patient: Tammy Chase  Procedure(s) Performed: CESAREAN SECTION (N/A )  Patient Location: PACU and L&D  Anesthesia Type:Spinal  Level of Consciousness: awake, alert  and oriented  Airway & Oxygen Therapy: Patient Spontanous Breathing  Post-op Assessment: Post -op Vital signs reviewed and stable  Post vital signs: stable  Last Vitals:  Vitals Value Taken Time  BP 108/54   Temp    Pulse 98   Resp 12   SpO2 99     Last Pain:  Vitals:   09/30/19 0556  TempSrc: Oral  PainSc: 4          Complications: No complications documented.

## 2019-09-30 NOTE — Lactation Note (Signed)
This note was copied from a baby's chart. Lactation Consultation Note  Patient Name: Tammy Chase [id number] Date: 09/30/2019 Reason for consult: Initial assessment;Term  Lactation in to see mom and baby. This is mom's second baby, delivered via repeat c/s this morning. Mom reports feeding difficulties with first baby not wanting to latch; pumping for 2 months. LC assisted with baby's latch, baby displayed wide open mouth, flanged top/bottom lips, and rhythmic sucking pattern, mom noting tugging sensation no pain or discomfort.  LC reviewed optimal positioning and alignment, slightly turning baby in, and tips for keeping baby alert and awake throughout feeding. Encouraged to keep baby alert and feeding on one side as long as baby is active, on then switching to other side. Encouraged to not watch the clock but to keep baby active. Reviewed early signs of hunger, newborn stomach size, feeding patterns, benefits of skin to skin, normal course of lactation and milk supply and demand. Briefly discussed option of initiating pumping regimen along with feeding on demand to help ensure a plentiful milk supply; mom open to suggestions. Lactation number pointed out to mom on the whiteboard and was encouraged to call for help at next feeding.  Maternal Data Formula Feeding for Exclusion: No Has patient been taught Hand Expression?: Yes Does the patient have breastfeeding experience prior to this delivery?: Yes  Feeding Feeding Type: Breast Fed  LATCH Score Latch: Repeated attempts needed to sustain latch, nipple held in mouth throughout feeding, stimulation needed to elicit sucking reflex.  Audible Swallowing: A few with stimulation  Type of Nipple: Everted at rest and after stimulation  Comfort (Breast/Nipple): Soft / non-tender  Hold (Positioning): Assistance needed to correctly position infant at breast and maintain latch.  LATCH Score: 7  Interventions Interventions: Breast feeding  basics reviewed;Adjust position;Hand express  Lactation Tools Discussed/Used     Consult Status Consult Status: Follow-up Follow-up type: Call as needed    Danford Bad 09/30/2019, 1:08 PM

## 2019-10-01 ENCOUNTER — Ambulatory Visit: Payer: Self-pay

## 2019-10-01 ENCOUNTER — Encounter: Payer: Self-pay | Admitting: Obstetrics and Gynecology

## 2019-10-01 ENCOUNTER — Other Ambulatory Visit: Payer: BC Managed Care – PPO

## 2019-10-01 LAB — CBC
HCT: 29.1 % — ABNORMAL LOW (ref 36.0–46.0)
Hemoglobin: 9.6 g/dL — ABNORMAL LOW (ref 12.0–15.0)
MCH: 28.4 pg (ref 26.0–34.0)
MCHC: 33 g/dL (ref 30.0–36.0)
MCV: 86.1 fL (ref 80.0–100.0)
Platelets: 151 10*3/uL (ref 150–400)
RBC: 3.38 MIL/uL — ABNORMAL LOW (ref 3.87–5.11)
RDW: 16.3 % — ABNORMAL HIGH (ref 11.5–15.5)
WBC: 15.6 10*3/uL — ABNORMAL HIGH (ref 4.0–10.5)
nRBC: 0 % (ref 0.0–0.2)

## 2019-10-01 LAB — CREATININE, SERUM
Creatinine, Ser: 0.87 mg/dL (ref 0.44–1.00)
GFR calc Af Amer: >60 mL/min (ref >60)
GFR calc non Af Amer: >60 mL/min (ref >60)

## 2019-10-01 MED ORDER — IBUPROFEN 600 MG PO TABS
600.0000 mg | ORAL_TABLET | Freq: Four times a day (QID) | ORAL | Status: DC
  Administered 2019-10-01 (×2): 600 mg via ORAL
  Filled 2019-10-01 (×2): qty 1

## 2019-10-01 MED ORDER — OXYCODONE HCL 5 MG PO TABS
5.0000 mg | ORAL_TABLET | Freq: Four times a day (QID) | ORAL | 0 refills | Status: AC | PRN
Start: 2019-10-01 — End: 2019-10-06

## 2019-10-01 NOTE — Discharge Instructions (Signed)
Discharge Instructions:   Follow-up Appointment: Tuesday, August 31st at 8:10am with Dr. Jean Rosenthal at [facility]!   If there are any new medications, they have been ordered and will be available for pickup at the listed pharmacy on your way home from the hospital.   Call office if you have any of the following: headache, visual changes, fever >101.0 F, chills, shortness of breath, breast concerns, excessive vaginal bleeding, incision drainage or problems, leg pain or redness, depression or any other concerns. If you have vaginal discharge with an odor, let your doctor know.   It is normal to bleed for up to 6 weeks. You should not soak through more than 1 pad in 1 hour. If you have a blood clot larger than your fist with continued bleeding, call your doctor.   After a c-section, you should expect a small amount of blood or clear fluid coming from the incision and abdominal cramping/soreness. Inspect your incision site daily. Stand in front of a mirror to look for any redness, incision opening, or discolored/odorness drainage. Take a shower daily and continue good hygiene. Use own towel and washcloth (do not share). Make sure your sheets on your bed are clean. No pets sleeping around your incision site. Dressing will be removed at your postpartum visit. If the dressing does become wet or soiled underneath, it is okay to remove it.   Activity: Do not lift > 10 lbs for 6 weeks (do not lift anything heavier than your baby). No intercourse, tampons, swimming pools, hot tubs, baths (only showers) for 6 weeks.  No driving for 1-2 weeks. Continue prenatal vitamin, especially if breastfeeding. Increase calories and fluids (water) while breastfeeding.   Your milk will come in, in the next couple of days (right now it is colostrum). You may have a slight fever when your milk comes in, but it should go away on its own.  If it does not, and rises above 101 F please call the doctor. You will also feel achy and  your breasts will be firm. They will also start to leak. If you are breastfeeding, continue as you have been and you can pump/express milk for comfort.   If you have too much milk, your breasts can become engorged, which could lead to mastitis. This is an infection of the milk ducts. It can be very painful and you will need to notify your doctor to obtain a prescription for antibiotics. You can also treat it with a shower or hot/cold compress.   For concerns about your baby, please call your pediatrician.  For breastfeeding concerns, the lactation consultant can be reached at [phone].   Postpartum blues (feelings of happy one minute and sad another minute) are normal for the first few weeks but if it gets worse let your doctor know.   Congratulations! We enjoyed caring for you and your new bundle of joy!

## 2019-10-01 NOTE — Anesthesia Post-op Follow-up Note (Signed)
  Anesthesia Pain Follow-up Note  Patient: Tammy Chase  Day #: 1  Date of Follow-up: 10/01/2019 Time: 8:01 AM  Last Vitals:  Vitals:   09/30/19 2100 09/30/19 2300  BP:  (!) 96/54  Pulse: 90 73  Resp:  18  Temp:  36.9 C  SpO2: 97% 98%    Level of Consciousness: alert  Pain: none   Side Effects:None  Catheter Site Exam:clean, dry, no drainage  Anti-Coag Meds (From admission, onward)   Start     Dose/Rate Route Frequency Ordered Stop   10/01/19 1000  enoxaparin (LOVENOX) injection 40 mg        40 mg Subcutaneous Every 24 hours 09/30/19 1139         Plan: D/C from anesthesia care at surgeon's request  Starling Manns

## 2019-10-01 NOTE — Lactation Note (Signed)
This note was copied from a baby's chart. Lactation Consultation Note  Patient Name: Girl Tianne Plott [id number] Date: 10/01/2019   [facility] worked with mom and baby on positioning and latch. After several minutes of observation and baby's optimal latch it was determined that minimal transfer was taking place. LC did not audibly hear swallows, and did not visually see the jaw drop to indicate a swallow. LC probed to learn that mom has hx of PCOS, and conceived this baby through IVF. PCOS can have an impact on ability to achieve a full milk supply, mom was educated on this impact and signs to look for that baby was not being satisfied at the breast. LC set mom up with DEBP, instructed on use, cleaning, milk storage, and frequency. Encouraged to use every 3 hours, and continue to offer baby the breast on demand. Mom aware that she may need to supplement and is ok with baby receiving formula.  RN was updated prior to [facility] leaving for the day.  Maternal Data    Feeding Feeding Type: Breast Fed  [facility] Score                   Interventions    Lactation Tools Discussed/Used     Consult Status      Danford Bad 10/01/2019, 4:00 PM

## 2019-10-01 NOTE — Progress Notes (Signed)
Pt discharged with infant.  Discharge instructions, prescriptions and follow up appointment given to and reviewed with pt. Pt verbalized understanding. Escorted out by auxillary. 

## 2019-10-01 NOTE — Progress Notes (Signed)
Obstetric Postpartum/PostOperative Daily Progress Note Subjective:  34 y.o. [id number] post-operative day # 1 status post repeat cesarean delivery.  She is ambulating, is tolerating po, is voiding spontaneously.  Her pain is well controlled on PO pain medications. Her lochia is less than menses. She reports some supply issues with breastfeeding. She plans to see LC.    Medications SCHEDULED MEDICATIONS  . enoxaparin (LOVENOX) injection  40 mg Subcutaneous Q24H  . ferrous sulfate  325 mg Oral BID WC  . ibuprofen  600 mg Oral Q6H  . prenatal multivitamin  1 tablet Oral Q1200  . senna-docusate  2 tablet Oral Q24H  . simethicone  80 mg Oral TID PC    MEDICATION INFUSIONS    PRN MEDICATIONS  coconut oil, witch hazel-glycerin **AND** dibucaine, diphenhydrAMINE, menthol-cetylpyridinium, oxyCODONE-acetaminophen **OR** oxyCODONE-acetaminophen    Objective:   Vitals:   09/30/19 2030 09/30/19 2100 09/30/19 2300 10/01/19 0821  BP:   (!) 96/54 (!) 100/51  Pulse: 81 90 73 74  Resp:   18 18  Temp:   98.5 F (36.9 C) 98 F (36.7 C)  TempSrc:   Oral Oral  SpO2: 98% 97% 98% 100%  Weight:      Height:        Current Vital Signs 24h Vital Sign Ranges  T 98 F (36.7 C) Temp  Avg: 97.9 F (36.6 C)  Min: 97.4 F (36.3 C)  Max: 98.5 F (36.9 C)  BP (!) 100/51 BP  Min: 92/53  Max: 127/63  HR 74 Pulse  Avg: 74.1  Min: 64  Max: 91  RR 18 Resp  Avg: 19.7  Min: 10  Max: 31  SaO2 100 % Room Air SpO2  Avg: 96.9 %  Min: 94 %  Max: 100 %       24 Hour I/O Current Shift I/O  Time Ins Outs 08/24 0701 - 08/25 0700 In: 631.8 [I.V.:531.8] Out: 2128 [Urine:1430] 08/25 0701 - 08/25 1900 In: -  Out: 900 [Urine:900]  General: NAD Pulmonary: no increased work of breathing Abdomen: non-distended, non-tender, fundus firm at level of umbilicus Inc: Clean/dry/intact Extremities: no edema, no erythema, no tenderness  Labs:  Recent Labs  Lab 09/29/19 0910 10/01/19 0618  WBC 9.1 15.6*  HGB 11.3* 9.6*   HCT 34.7* 29.1*  PLT 143* 151     Assessment:   34 y.o. G2P2002 postoperative day # 1 status post repeat cesarean section, lactating  Plan:  1) Acute blood loss anemia - hemodynamically stable and asymptomatic - po ferrous sulfate  2) A NEG / baby also Rh negative- Rhogam not indicated  3) Rubella 3.52 (01/28 0916)/ Varicella Immune  4) TDAP status: given antepartum  5) Breast and Formula feeding  6) Contraception = will discuss at postpartum visit regarding best option considering PCOS history  7) Disposition: continue current care   Tresea Mall, CNM 10/01/2019 9:58 AM

## 2019-10-01 NOTE — TOC Initial Note (Signed)
Transition of Care [facility]) - Initial/Assessment Note    Patient Details  Name: Tammy Chase MRN: [id number] Date of Birth: Sep 21, 1985  Transition [facility] Care [facility]) CM/SW Contact:    [place]if[place]own Cellar, RN Phone Number: 10/01/2019, 1:30 PM  Clinical Narrative:                 Spoke to patient and spouse at bedside. FOB was holding infant during assessment. MOB states she will be home with infant until Jan 2022. Has all needed equipment at home including car seat and breast pump. Hopeful to breastfeed and supplement with formula as needed. Patient states she had to supplement with her last pregnancy 5 years ago. Is currently working with [facility] here at hospital for assistance. Infant has appointment scheduled at [facility] for follow up as well as mother at [facility]. MOB states she had some issues with PPD after first pregnancy and has already began talking with her OB. Discussed s/s of PPD in detail with both parents. Discussed SIDS and importance of hand hygiene. Nobody in the home smokes however parents have already talked with outside relatives about no smoking around infant. Parents with no other questions or concerns at this time and eager to discharge home to introduce other child. No needs or concerns from [facility] at this time.         Patient Goals and CMS Choice        Expected Discharge Plan and Services                                                Prior Living Arrangements/Services                       Activities of Daily Living Home Assistive Devices/Equipment: None ADL Screening (condition at time of admission) Patient's cognitive ability adequate to safely complete daily activities?: Yes Is the patient deaf or have difficulty hearing?: No Does the patient have difficulty seeing, even when wearing glasses/contacts?: No Does the patient have difficulty concentrating, remembering, or making decisions?: No Patient able to express need for assistance with  ADLs?: Yes Does the patient have difficulty dressing or bathing?: No Independently performs ADLs?: Yes (appropriate for developmental age) Does the patient have difficulty walking or climbing stairs?: No Weakness of Legs: None Weakness of Arms/Hands: None  Permission Sought/Granted                  Emotional Assessment              Admission diagnosis:  History of cesarean delivery [Z98.891] Patient Active Problem List   Diagnosis Date Noted  . History of cesarean delivery 09/30/2019  . [redacted] weeks gestation of pregnancy 09/30/2019  . Positive GBS test 09/09/2019  . Third trimester bleeding 09/09/2019  . Vaginal bleeding in pregnancy, third trimester 08/31/2019  . Rh negative state in antepartum period 03/12/2019  . Nausea and vomiting during pregnancy 03/12/2019  . Supervision of high risk pregnancy, antepartum 02/27/2019  . Pregnancy conceived through in vitro fertilization 02/27/2019  . Obesity affecting pregnancy 02/27/2019  . BMI 40.0-44.9, adult (HCC) 02/27/2019  . History of cesarean delivery affecting pregnancy 02/27/2019  . Infertility, anovulation 03/16/2017  . Irregular menstrual bleeding 11/21/2016  . Pregnancy 09/23/2014   PCP:  Duanne Limerick, MD Pharmacy:   [facility] [phone] [facility],  [place] - [address] [place] [address] Phone: [phone] Fax: [phone]     Social Determinants of Health (SDOH) Interventions    Readmission Risk Interventions No flowsheet data found.

## 2019-10-01 NOTE — Anesthesia Postprocedure Evaluation (Signed)
Anesthesia Post Note  Patient: Malaka Ruffner  Procedure(s) Performed: CESAREAN SECTION (N/A )  Patient location during evaluation: Mother Baby Anesthesia Type: Spinal Level of consciousness: oriented and awake and alert Pain management: pain level controlled Vital Signs Assessment: post-procedure vital signs reviewed and stable Respiratory status: spontaneous breathing and respiratory function stable Cardiovascular status: blood pressure returned to baseline and stable Postop Assessment: no headache, no backache, no apparent nausea or vomiting and able to ambulate Anesthetic complications: no   No complications documented.   Last Vitals:  Vitals:   09/30/19 2100 09/30/19 2300  BP:  (!) 96/54  Pulse: 90 73  Resp:  18  Temp:  36.9 C  SpO2: 97% 98%    Last Pain:  Vitals:   09/30/19 2300  TempSrc: Oral  PainSc: 0-No pain                 Starling Manns

## 2019-10-02 ENCOUNTER — Telehealth: Payer: Self-pay

## 2019-10-02 ENCOUNTER — Other Ambulatory Visit: Payer: BC Managed Care – PPO

## 2019-10-02 NOTE — Telephone Encounter (Signed)
FMLA/DISABILITY form for [employer] filled out signature obtained and given to [facility] for processing.

## 2019-10-07 ENCOUNTER — Encounter: Payer: Self-pay | Admitting: Obstetrics and Gynecology

## 2019-10-07 ENCOUNTER — Ambulatory Visit (INDEPENDENT_AMBULATORY_CARE_PROVIDER_SITE_OTHER): Payer: BC Managed Care – PPO | Admitting: Obstetrics and Gynecology

## 2019-10-07 ENCOUNTER — Other Ambulatory Visit: Payer: Self-pay

## 2019-10-07 DIAGNOSIS — Z09 Encounter for follow-up examination after completed treatment for conditions other than malignant neoplasm: Secondary | ICD-10-CM

## 2019-10-07 NOTE — Progress Notes (Signed)
   Postoperative Follow-up Patient presents post op from cesarean section  1 week ago.  Subjective: She denies fever, chills, nausea and vomiting. Eating a regular diet without difficulty. She has some pain in her upper back. Denies nuchal rigitidy.   Activity: increasing slowly. She denies issues with her incision.     She scored a 10.5 on her EPDS today (she scored 15 prior to discharge).  She states she still has issues with it being easy for her to cry. She is overall doing well.  She is just worried things will get bad like they did last time.  Denies SI/HI today.   Objective: BP 118/78   Ht 5\' 1"  (1.549 m)   Wt 216 lb (98 kg)   BMI 40.81 kg/m   Constitutional: Well nourished, well developed female in no acute distress.  HEENT: normal Skin: Warm and dry.  Abdomen: s/nt/nd/uterine fundus at U-1 clean, dry, intact and without erythema, induration, warmth, and tenderness Extremity: no edema   Assessment: 34 y.o. s/p cesarean section progressing well  Plan: Patient has done well after surgery with no apparent complications.  I have discussed the post-operative course to date, and the expected progress moving forward.  The patient understands what complications to be concerned about.    Activity plan: increase slowly  Postpartum mood follow up in one week.   Return in about 1 week (around 10/14/2019) for Telephone follow up for mood check.  12/14/2019, MD 10/07/2019 8:37 AM

## 2019-10-14 ENCOUNTER — Ambulatory Visit: Payer: BC Managed Care – PPO | Admitting: Obstetrics and Gynecology

## 2019-12-10 ENCOUNTER — Encounter: Payer: Self-pay | Admitting: Obstetrics and Gynecology

## 2019-12-10 ENCOUNTER — Other Ambulatory Visit: Payer: Self-pay

## 2019-12-10 ENCOUNTER — Ambulatory Visit (INDEPENDENT_AMBULATORY_CARE_PROVIDER_SITE_OTHER): Payer: BC Managed Care – PPO | Admitting: Obstetrics and Gynecology

## 2019-12-10 DIAGNOSIS — F53 Postpartum depression: Secondary | ICD-10-CM

## 2019-12-10 DIAGNOSIS — O99345 Other mental disorders complicating the puerperium: Secondary | ICD-10-CM

## 2019-12-10 MED ORDER — ESCITALOPRAM OXALATE 10 MG PO TABS: 10.0000 mg | ORAL_TABLET | Freq: Every day | ORAL | 0 refills | Status: DC

## 2019-12-10 NOTE — Progress Notes (Signed)
Postpartum Visit   Chief Complaint  Patient presents with  . Postpartum Care   History of Present Illness: Patient is a 34 y.o. [id number] presents for postpartum visit.  Date of delivery: 09/30/2019 Type of delivery: C-section, repeat Episiotomy No.  Laceration: not applicable  Pregnancy or labor problems:  Yes, pregnancy by IVF (ART) rh negative status, obesity in pregnancy, GBS positive Any problems since the delivery:  Yes, depression and anxiety  Newborn Details:  SINGLETON :  1. Baby's name: Zollie Scale. Birth weight: 3,480 grams (7 lb 11 oz) Maternal Details:  Breast Feeding:  yes Post partum depression/anxiety noted:  yes Edinburgh Post-Partum Depression Score:  18  Date of last PAP: 02/27/2019  Normal, HPV negative   Past Medical History:  Diagnosis Date  . Anemia   . Obesity affecting pregnancy   . PCOS (polycystic ovarian syndrome)     Past Surgical History:  Procedure Laterality Date  . CESAREAN SECTION N/A 09/25/2014   Procedure: CESAREAN SECTION;  Surgeon: Scott Bing, MD;  Location: [facility] ORS;  Service: Obstetrics;  Laterality: N/A;  . CESAREAN SECTION N/A 09/30/2019   Procedure: CESAREAN SECTION;  Surgeon: Conard Novak, MD;  Location: [facility] ORS;  Service: Obstetrics;  Laterality: N/A;    Prior to Admission medications   Medication Sig Start Date End Date Taking? Authorizing Provider  Prenatal Vit-Fe Fumarate-FA (MULTIVITAMIN-PRENATAL) 27-0.8 MG TABS tablet Take 1 tablet by mouth every evening.    Yes [provider]   Allergies: No Known Allergies   Social History   Socioeconomic History  . Marital status: Married    Spouse name: Not on file  . Number of children: Not on file  . Years of education: Not on file  . Highest education level: Not on file  Occupational History  . Not on file  Tobacco Use  . Smoking status: Never Smoker  . Smokeless tobacco: Never Used  Vaping Use  . Vaping Use: Never used  Substance and Sexual Activity    . Alcohol use: Not Currently    Comment: occasional  . Drug use: No  . Sexual activity: Yes    Comment: undecided  Other Topics Concern  . Not on file  Social History Narrative  . Not on file   Social Determinants of Health   Financial Resource Strain:   . Difficulty of Paying Living Expenses: Not on file  Food Insecurity:   . Worried About Programme researcher, broadcasting/film/video in the Last Year: Not on file  . Ran Out of Food in the Last Year: Not on file  Transportation Needs:   . Lack of Transportation (Medical): Not on file  . Lack of Transportation (Non-Medical): Not on file  Physical Activity:   . Days of Exercise per Week: Not on file  . Minutes of Exercise per Session: Not on file  Stress:   . Feeling of Stress : Not on file  Social Connections:   . Frequency of Communication with Friends and Family: Not on file  . Frequency of Social Gatherings with Friends and Family: Not on file  . Attends Religious Services: Not on file  . Active Member of Clubs or Organizations: Not on file  . Attends Banker Meetings: Not on file  . Marital Status: Not on file  Intimate Partner Violence:   . Fear of Current or Ex-Partner: Not on file  . Emotionally Abused: Not on file  . Physically Abused: Not on file  . Sexually Abused: Not on file  Family History  Problem Relation Age of Onset  . Cancer Brother        Pancreatic  . Diabetes Maternal Grandmother   . Stroke Maternal Grandmother   . Cancer Maternal Grandfather        Lung (smoker)    Review of Systems  Constitutional: Negative.   HENT: Negative.   Eyes: Negative.   Respiratory: Negative.   Cardiovascular: Negative.   Gastrointestinal: Negative.   Genitourinary: Negative.   Musculoskeletal: Negative.   Skin: Negative.   Neurological: Negative.   Psychiatric/Behavioral: Positive for depression. Negative for hallucinations, memory loss, substance abuse and suicidal ideas. The patient is nervous/anxious. The patient  does not have insomnia.      Physical Exam BP 127/83   Ht 5\' 1"  (1.549 m)   Wt 226 lb (102.5 kg)   BMI 42.70 kg/m   Physical Exam Constitutional:      General: She is not in acute distress.    Appearance: Normal appearance. She is well-developed.  HENT:     Head: Normocephalic and atraumatic.  Eyes:     General: No scleral icterus.    Conjunctiva/sclera: Conjunctivae normal.  Cardiovascular:     Rate and Rhythm: Normal rate and regular rhythm.     Heart sounds: No murmur heard.  No friction rub. No gallop.   Pulmonary:     Effort: Pulmonary effort is normal. No respiratory distress.     Breath sounds: Normal breath sounds. No wheezing or rales.  Abdominal:     General: Bowel sounds are normal. There is no distension.     Palpations: Abdomen is soft. There is no mass.     Tenderness: There is no abdominal tenderness. There is no guarding or rebound.     Comments: without erythema, induration, warmth, and tenderness. It is clean, dry, and intact.    Musculoskeletal:        General: Normal range of motion.     Cervical back: Normal range of motion and neck supple.  Neurological:     General: No focal deficit present.     Mental Status: She is alert and oriented to person, place, and time.     Cranial Nerves: No cranial nerve deficit.  Skin:    General: Skin is warm and dry.     Findings: No erythema.  Psychiatric:        Mood and Affect: Mood normal.        Behavior: Behavior normal.        Judgment: Judgment normal.      Female Chaperone present during breast and/or pelvic exam.  Assessment: 34 y.o. 34 presenting for 6 week postpartum visit  Plan: Problem List Items Addressed This Visit    None    Visit Diagnoses    Postpartum care and examination    -  Primary   Relevant Medications   escitalopram (LEXAPRO) 10 MG tablet   Postpartum depression       Relevant Medications   escitalopram (LEXAPRO) 10 MG tablet     1) Contraception: undecided.  2)   Pap - ASCCP guidelines and rational discussed.  Patient opts for routine screening interval  3) Patient underwent screening for postpartum depression with some concerns noted. Will start treatment. She is able to contract for safety. We discussed resources for safety should SI/HI develop.   Return in about 4 weeks (around 01/07/2020) for Medication Follow up in 3-4 weeks.   14/02/2019, MD 12/10/2019 8:56 AM

## 2020-01-01 ENCOUNTER — Other Ambulatory Visit: Payer: Self-pay | Admitting: Obstetrics and Gynecology

## 2020-01-01 DIAGNOSIS — F53 Postpartum depression: Secondary | ICD-10-CM

## 2020-01-07 ENCOUNTER — Encounter: Payer: Self-pay | Admitting: Obstetrics and Gynecology

## 2020-01-07 ENCOUNTER — Ambulatory Visit (INDEPENDENT_AMBULATORY_CARE_PROVIDER_SITE_OTHER): Payer: BC Managed Care – PPO | Admitting: Obstetrics and Gynecology

## 2020-01-07 ENCOUNTER — Other Ambulatory Visit: Payer: Self-pay

## 2020-01-07 VITALS — BP 121/84 | HR 79 | Resp 18 | Ht 61.0 in | Wt 231.6 lb

## 2020-01-07 DIAGNOSIS — F53 Postpartum depression: Secondary | ICD-10-CM | POA: Diagnosis not present

## 2020-01-07 DIAGNOSIS — O99345 Other mental disorders complicating the puerperium: Secondary | ICD-10-CM | POA: Diagnosis not present

## 2020-01-07 MED ORDER — ESCITALOPRAM OXALATE 20 MG PO TABS: 20.0000 mg | ORAL_TABLET | Freq: Every day | ORAL | 0 refills | Status: DC

## 2020-01-07 NOTE — Progress Notes (Signed)
Obstetrics & Gynecology Office Visit   Chief Complaint:  Chief Complaint  Patient presents with  . Postpartum Care    History of Present Illness: 34 y.o. G65P2002 female who is about 10 weeks postpartum from a repeat c-section no 09/30/2019 who presents for a medication follow up after starting escitalopram 10 mg at her 6 week postpartum visit for postpartum depression.  Her EPDS score last time was 17.  Her score today is 17.    She states that nothing has gotten worse.  Her workplace has provided therapy for her. She has met with someone twice so far.  She feels like she is dealing with anger a little better and she is getting angry less.  She feels like her anxiety is really bad.    She is taking the medication. The only side effect she has had  Is that he medication has made her tired. She has little motivation other than to care for her baby. She goes back to work at the beginning of the year. She is anxious about returning to work.     Past Medical History:  Diagnosis Date  . Anemia   . Obesity affecting pregnancy   . PCOS (polycystic ovarian syndrome)     Past Surgical History:  Procedure Laterality Date  . CESAREAN SECTION N/A 09/25/2014   Procedure: CESAREAN SECTION;  Surgeon: Aletha Halim, MD;  Location: [facility] ORS;  Service: Obstetrics;  Laterality: N/A;  . CESAREAN SECTION N/A 09/30/2019   Procedure: CESAREAN SECTION;  Surgeon: Will Bonnet, MD;  Location: [facility] ORS;  Service: Obstetrics;  Laterality: N/A;    Gynecologic History: No LMP recorded.  Obstetric History: [id number]  Family History  Problem Relation Age of Onset  . Cancer Brother        Pancreatic  . Diabetes Maternal Grandmother   . Stroke Maternal Grandmother   . Cancer Maternal Grandfather        Lung (smoker)    Social History   Socioeconomic History  . Marital status: Married    Spouse name: Not on file  . Number of children: Not on file  . Years of education: Not on file  . Highest  education level: Not on file  Occupational History  . Not on file  Tobacco Use  . Smoking status: Never Smoker  . Smokeless tobacco: Never Used  Vaping Use  . Vaping Use: Never used  Substance and Sexual Activity  . Alcohol use: Not Currently    Comment: occasional  . Drug use: No  . Sexual activity: Yes    Comment: undecided  Other Topics Concern  . Not on file  Social History Narrative  . Not on file   Social Determinants of Health   Financial Resource Strain:   . Difficulty of Paying Living Expenses: Not on file  Food Insecurity:   . Worried About Charity fundraiser in the Last Year: Not on file  . Ran Out of Food in the Last Year: Not on file  Transportation Needs:   . Lack of Transportation (Medical): Not on file  . Lack of Transportation (Non-Medical): Not on file  Physical Activity:   . Days of Exercise per Week: Not on file  . Minutes of Exercise per Session: Not on file  Stress:   . Feeling of Stress : Not on file  Social Connections:   . Frequency of Communication with Friends and Family: Not on file  . Frequency of Social Gatherings with Friends and Family: Not  on file  . Attends Religious Services: Not on file  . Active Member of Clubs or Organizations: Not on file  . Attends Archivist Meetings: Not on file  . Marital Status: Not on file  Intimate Partner Violence:   . Fear of Current or Ex-Partner: Not on file  . Emotionally Abused: Not on file  . Physically Abused: Not on file  . Sexually Abused: Not on file    No Known Allergies  Prior to Admission medications   Medication Sig Start Date End Date Taking? Authorizing Provider  escitalopram (LEXAPRO) 10 MG tablet TAKE 1 TABLET BY MOUTH EVERY DAY 01/05/20   Will Bonnet, MD  Prenatal Vit-Fe Fumarate-FA (MULTIVITAMIN-PRENATAL) 27-0.8 MG TABS tablet Take 1 tablet by mouth every evening.     [provider]    Review of Systems  Constitutional: Negative.   HENT: Negative.     Eyes: Negative.   Respiratory: Negative.   Cardiovascular: Negative.   Gastrointestinal: Negative.   Genitourinary: Negative.   Musculoskeletal: Negative.   Skin: Negative.   Neurological: Negative.   Psychiatric/Behavioral: Positive for depression. Negative for hallucinations, memory loss, substance abuse and suicidal ideas. The patient is nervous/anxious. The patient does not have insomnia.      Physical Exam BP 121/84   Pulse 79   Resp 18   Ht 5' 1" (1.549 m)   Wt 231 lb 9.6 oz (105.1 kg)   SpO2 98%   Breastfeeding Yes   BMI 43.76 kg/m  No LMP recorded. Physical Exam Constitutional:      General: She is not in acute distress.    Appearance: Normal appearance.  HENT:     Head: Normocephalic and atraumatic.  Eyes:     General: No scleral icterus.    Conjunctiva/sclera: Conjunctivae normal.  Neurological:     General: No focal deficit present.     Mental Status: She is alert and oriented to person, place, and time.     Cranial Nerves: No cranial nerve deficit.  Psychiatric:        Mood and Affect: Mood normal.        Behavior: Behavior normal.        Judgment: Judgment normal.    Female chaperone present for pelvic and breast  portions of the physical exam  Assessment: 33 y.o. G84P2002 female here for  1. Postpartum depression   2. Postpartum care and examination    Plan: Problem List Items Addressed This Visit    None    Visit Diagnoses    Postpartum depression    -  Primary   Relevant Medications   escitalopram (LEXAPRO) 20 MG tablet   Postpartum care and examination       Relevant Medications   escitalopram (LEXAPRO) 20 MG tablet     Will increase dose of Lexapro to 20 mg. Will see how affects her depression and anxiety. Follow up in one month, prior to her returning to work, to assess her anxiety around returning to work and changes associated with this dosing adjustment.  A total of 23 minutes were spent face-to-face with the patient as well as  preparation, review, communication, and documentation during this encounter.    Prentice Docker, MD 01/07/2020 8:30 AM

## 2020-02-05 ENCOUNTER — Ambulatory Visit: Payer: BC Managed Care – PPO | Admitting: Obstetrics and Gynecology

## 2020-11-08 ENCOUNTER — Ambulatory Visit: Payer: BC Managed Care – PPO | Admitting: Obstetrics and Gynecology

## 2020-11-17 ENCOUNTER — Other Ambulatory Visit: Payer: Self-pay

## 2020-11-17 ENCOUNTER — Ambulatory Visit (INDEPENDENT_AMBULATORY_CARE_PROVIDER_SITE_OTHER): Payer: BC Managed Care – PPO | Admitting: Obstetrics and Gynecology

## 2020-11-17 ENCOUNTER — Encounter: Payer: Self-pay | Admitting: Obstetrics and Gynecology

## 2020-11-17 VITALS — BP 127/79 | Ht 61.0 in | Wt 240.0 lb

## 2020-11-17 DIAGNOSIS — Z30011 Encounter for initial prescription of contraceptive pills: Secondary | ICD-10-CM

## 2020-11-17 DIAGNOSIS — Z1339 Encounter for screening examination for other mental health and behavioral disorders: Secondary | ICD-10-CM | POA: Diagnosis not present

## 2020-11-17 DIAGNOSIS — Z1331 Encounter for screening for depression: Secondary | ICD-10-CM | POA: Diagnosis not present

## 2020-11-17 DIAGNOSIS — Z01419 Encounter for gynecological examination (general) (routine) without abnormal findings: Secondary | ICD-10-CM

## 2020-11-17 NOTE — Progress Notes (Signed)
Gynecology Annual Exam  PCP: Duanne Limerick, MD   Chief Complaint  Patient presents with   Annual Exam    History of Present Illness:  Ms. Anistyn Graddy is a 35 y.o. [phone] who LMP was Patient's last menstrual period was 11/15/2020 (exact date)., presents today for her annual examination.  Her menses are pretty irregular. She has only had two menses since delivering in 09/2019.  She did breast feed for about 6 months.   She is sexually active. She has occasional pain, but she believes this is because she has little interest.    Last Pap: 02/2019  Results were: no abnormalities /neg HPV DNA negative Hx of STDs: none  There is no FH of breast cancer. There is no FH of ovarian cancer. The patient does not do self-breast exams.  Tobacco use: The patient denies current or previous tobacco use. Alcohol use: social drinker Exercise: 10-20 minute walks 3-4 times per week.   After her delivery her black terminal facial hair turned more blond.    The patient wears seatbelts: yes.   The patient reports that domestic violence in her life is absent.   She was prescribed Saxenda and she wonders whether she should take it or not.  Patient is a 35 y.o. [id number] presenting for contraception consult.  She is currently on nothing for contraception and desiring to start OCP (estrogen/progesterone).  She has a past medical history significant for no contraindication to estrogen.  She specifically denies a history of migraine with aura, chronic hypertension, history of DVT/PE, and smoking.  Reported Patient's last menstrual period was 11/15/2020 (exact date)..     Past Medical History:  Diagnosis Date   Anemia    Obesity affecting pregnancy    PCOS (polycystic ovarian syndrome)     Past Surgical History:  Procedure Laterality Date   CESAREAN SECTION N/A 09/25/2014   Procedure: CESAREAN SECTION;  Surgeon: [place] Bing, MD;  Location: [facility];  Service: Obstetrics;  Laterality: N/A;   CESAREAN  SECTION N/A 09/30/2019   Procedure: CESAREAN SECTION;  Surgeon: Conard Novak, MD;  Location: [facility];  Service: Obstetrics;  Laterality: N/A;    Prior to Admission medications   Medication Sig Start Date End Date Taking? Authorizing Provider  escitalopram (LEXAPRO) 20 MG tablet Take 1 tablet (20 mg total) by mouth daily. Patient not taking: Reported on 11/17/2020 01/07/20   Conard Novak, MD  Prenatal Vit-Fe Fumarate-FA (MULTIVITAMIN-PRENATAL) 27-0.8 MG TABS tablet Take 1 tablet by mouth every evening.  Patient not taking: Reported on 11/17/2020    [provider]    No Known Allergies  Obstetric History: [id number]  Social History   Socioeconomic History   Marital status: Married    Spouse name: Not on file   Number of children: Not on file   Years of education: Not on file   Highest education level: Not on file  Occupational History   Not on file  Tobacco Use   Smoking status: Never   Smokeless tobacco: Never  Vaping Use   Vaping Use: Never used  Substance and Sexual Activity   Alcohol use: Not Currently    Comment: occasional   Drug use: No   Sexual activity: Yes    Comment: undecided  Other Topics Concern   Not on file  Social History Narrative   Not on file   Social Determinants of Health   Financial Resource Strain: Not on file  Food Insecurity: Not on file  Transportation Needs:  Not on file  Physical Activity: Not on file  Stress: Not on file  Social Connections: Not on file  Intimate Partner Violence: Not on file    Family History  Problem Relation Age of Onset   Cancer Brother        Pancreatic   Diabetes Maternal Grandmother    Stroke Maternal Grandmother    Cancer Maternal Grandfather        Lung (smoker)    Review of Systems  Constitutional: Negative.   HENT: Negative.    Eyes: Negative.   Respiratory: Negative.    Cardiovascular: Negative.   Gastrointestinal: Negative.   Genitourinary: Negative.   Musculoskeletal:  Negative.   Skin: Negative.   Neurological: Negative.   Psychiatric/Behavioral: Negative.      Physical Exam BP 127/79   Ht 5\' 1"  (1.549 m)   Wt 240 lb (108.9 kg)   LMP 11/15/2020 (Exact Date)   BMI 45.35 kg/m    Physical Exam Constitutional:      General: She is not in acute distress.    Appearance: Normal appearance. She is well-developed.  HENT:     Head: Normocephalic and atraumatic.  Eyes:     General: No scleral icterus.    Conjunctiva/sclera: Conjunctivae normal.  Cardiovascular:     Rate and Rhythm: Normal rate and regular rhythm.     Heart sounds: No murmur heard.   No friction rub. No gallop.  Pulmonary:     Effort: Pulmonary effort is normal. No respiratory distress.     Breath sounds: Normal breath sounds. No wheezing or rales.  Abdominal:     General: Bowel sounds are normal. There is no distension.     Palpations: Abdomen is soft. There is no mass.     Tenderness: There is no abdominal tenderness. There is no guarding or rebound.  Musculoskeletal:        General: Normal range of motion.     Cervical back: Normal range of motion and neck supple.  Neurological:     General: No focal deficit present.     Mental Status: She is alert and oriented to person, place, and time.     Cranial Nerves: No cranial nerve deficit.  Skin:    General: Skin is warm and dry.     Findings: No erythema.  Psychiatric:        Mood and Affect: Mood normal.        Behavior: Behavior normal.        Judgment: Judgment normal.    Female chaperone present for pelvic and breast  portions of the physical exam  Results: AUDIT Questionnaire (screen for alcoholism): 1 PHQ-9: 8 GAD-7: 8   Assessment: 35 y.o. [id number] female here for routine annual gynecologic examination  Plan: Problem List Items Addressed This Visit   None Visit Diagnoses     Women's annual routine gynecological examination    -  Primary   Relevant Medications   norethindrone-ethinyl estradiol-FE (JUNEL FE  1/20) 1-20 MG-MCG tablet   Screening for depression       Screening for alcoholism       Encounter for initial prescription of contraceptive pills       Relevant Medications   norethindrone-ethinyl estradiol-FE (JUNEL FE 1/20) 1-20 MG-MCG tablet       Screening: -- Blood pressure screen normal -- Weight screening: obese: discussed management options, including lifestyle, dietary, and exercise. -- Depression screening negative (PHQ-9) -- Nutrition: normal -- cholesterol screening: not due for screening --  osteoporosis screening: not due -- tobacco screening: not using -- alcohol screening: AUDIT questionnaire indicates low-risk usage. -- family history of breast cancer screening: done. not at high risk. -- no evidence of domestic violence or intimate partner violence. -- STD screening: gonorrhea/chlamydia NAAT not collected per patient request. -- pap smear not collected per ASCCP guidelines -- flu vaccine  will get at her daugher's pediatrician  Thomasene Mohair, MD 11/17/2020 3:00 PM

## 2020-11-22 ENCOUNTER — Encounter: Payer: Self-pay | Admitting: Obstetrics and Gynecology

## 2020-11-22 MED ORDER — NORETHIN ACE-ETH ESTRAD-FE 1-20 MG-MCG PO TABS: 1.0000 | ORAL_TABLET | Freq: Every day | ORAL | 4 refills | Status: DC

## 2021-02-15 ENCOUNTER — Ambulatory Visit (INDEPENDENT_AMBULATORY_CARE_PROVIDER_SITE_OTHER): Payer: BC Managed Care – PPO | Admitting: Family Medicine

## 2021-02-15 ENCOUNTER — Other Ambulatory Visit: Payer: Self-pay

## 2021-02-15 ENCOUNTER — Encounter: Payer: Self-pay | Admitting: Family Medicine

## 2021-02-15 VITALS — BP 110/60 | HR 84 | Ht 61.0 in | Wt 232.0 lb

## 2021-02-15 DIAGNOSIS — H6983 Other specified disorders of Eustachian tube, bilateral: Secondary | ICD-10-CM

## 2021-02-15 DIAGNOSIS — J0100 Acute maxillary sinusitis, unspecified: Secondary | ICD-10-CM

## 2021-02-15 MED ORDER — MONTELUKAST SODIUM 10 MG PO TABS: 10.0000 mg | ORAL_TABLET | Freq: Every day | ORAL | 3 refills | Status: DC

## 2021-02-15 MED ORDER — AMOXICILLIN 500 MG PO CAPS: 500.0000 mg | ORAL_CAPSULE | Freq: Three times a day (TID) | ORAL | 0 refills | Status: DC

## 2021-02-15 NOTE — Progress Notes (Signed)
Date:  02/15/2021   Name:  Tammy Chase   DOB:  01/19/1986   MRN:  [id number]   Chief Complaint: Sinusitis (Pressure behind L) ear is bad, facial pressure, teeth hurting)  Sinusitis This is a new problem. The current episode started 1 to 4 weeks ago. The problem is unchanged. There has been no fever. Her pain is at a severity of 5/10. The pain is moderate. Associated symptoms include coughing, ear pain, headaches, sinus pressure and swollen glands. Pertinent negatives include no chills, congestion, diaphoresis, hoarse voice, neck pain, shortness of breath, sneezing or sore throat. Past treatments include oral decongestants. The treatment provided no relief.   Lab Results  Component Value Date   CREATININE 0.87 10/01/2019   GFRNONAA >60 10/01/2019   No results found for: CHOL, HDL, LDLCALC, LDLDIRECT, TRIG, CHOLHDL No results found for: TSH No results found for: HGBA1C Lab Results  Component Value Date   WBC 15.6 (H) 10/01/2019   HGB 9.6 (L) 10/01/2019   HCT 29.1 (L) 10/01/2019   MCV 86.1 10/01/2019   PLT 151 10/01/2019   No results found for: ALT, AST, GGT, ALKPHOS, BILITOT No results found for: 25OHVITD2, 25OHVITD3, VD25OH   Review of Systems  Constitutional:  Negative for chills and diaphoresis.  HENT:  Positive for ear pain, nosebleeds and sinus pressure. Negative for congestion, hoarse voice, sneezing and sore throat.        Lower teeth pain  Respiratory:  Positive for cough and wheezing. Negative for chest tightness and shortness of breath.   Cardiovascular:  Negative for chest pain and palpitations.  Musculoskeletal:  Negative for neck pain.  Neurological:  Positive for headaches.   Patient Active Problem List   Diagnosis Date Noted   Postpartum care following cesarean delivery 10/01/2019   Single liveborn infant, delivered by cesarean 10/01/2019   Encounter for care or examination of lactating mother 10/01/2019   History of cesarean delivery 09/30/2019    Positive GBS test 09/09/2019   Third trimester bleeding 09/09/2019   Vaginal bleeding in pregnancy, third trimester 08/31/2019   Rh negative state in antepartum period 03/12/2019   Nausea and vomiting during pregnancy 03/12/2019   Supervision of high risk pregnancy, antepartum 02/27/2019   Pregnancy conceived through in vitro fertilization 02/27/2019   Obesity affecting pregnancy 02/27/2019   BMI 40.0-44.9, adult (HCC) 02/27/2019   History of cesarean delivery affecting pregnancy 02/27/2019   Infertility, anovulation 03/16/2017   Irregular menstrual bleeding 11/21/2016   Pregnancy 09/23/2014    No Known Allergies  Past Surgical History:  Procedure Laterality Date   CESAREAN SECTION N/A 09/25/2014   Procedure: CESAREAN SECTION;  Surgeon: [place] Boro Bingharlie Pickens, MD;  Location: [facility];  Service: Obstetrics;  Laterality: N/A;   CESAREAN SECTION N/A 09/30/2019   Procedure: CESAREAN SECTION;  Surgeon: Conard NovakJackson, Stephen D, MD;  Location: [facility];  Service: Obstetrics;  Laterality: N/A;    Social History   Tobacco Use   Smoking status: Never   Smokeless tobacco: Never  Vaping Use   Vaping Use: Never used  Substance Use Topics   Alcohol use: Not Currently    Comment: occasional   Drug use: No     Medication list has been reviewed and updated.  No outpatient medications have been marked as taking for the 02/15/21 encounter (Office Visit) with Duanne LimerickJones, Paden Kuras C, MD.    [facility] 02/15/2021 08/01/2018  PHQ - 2 Score 0 0  PHQ- 9 Score 0 2    GAD 7 :  Generalized Anxiety Score 02/15/2021  Nervous, Anxious, on Edge 0  Control/stop worrying 0  Worry too much - different things 0  Trouble relaxing 0  Restless 0  Easily annoyed or irritable 0  Afraid - awful might happen 0  Total GAD 7 Score 0  Anxiety Difficulty Not difficult at all    BP Readings from Last 3 Encounters:  02/15/21 110/60  11/17/20 127/79  01/07/20 121/84    Physical Exam Vitals and nursing note reviewed.   Constitutional:      Appearance: She is well-developed.  HENT:     Head: Normocephalic.     Right Ear: Ear canal and external ear normal. Tympanic membrane is retracted.     Left Ear: Ear canal and external ear normal. Tympanic membrane is retracted.     Nose:     Right Turbinates: Swollen.     Left Turbinates: Swollen.     Right Sinus: Maxillary sinus tenderness present. No frontal sinus tenderness.     Left Sinus: No maxillary sinus tenderness or frontal sinus tenderness.     Mouth/Throat:     Dentition: Normal dentition.     Tongue: No lesions.     Palate: No mass.     Pharynx: Oropharynx is clear. Uvula midline. No pharyngeal swelling, oropharyngeal exudate or posterior oropharyngeal erythema.  Eyes:     General: Lids are everted, no foreign bodies appreciated. No scleral icterus.       Left eye: No foreign body or hordeolum.     Conjunctiva/sclera: Conjunctivae normal.     Right eye: Right conjunctiva is not injected.     Left eye: Left conjunctiva is not injected.     Pupils: Pupils are equal, round, and reactive to light.  Neck:     Thyroid: No thyroid mass or thyromegaly.     Vascular: No JVD.     Trachea: No tracheal deviation.      Comments: Tenderness left mandibular angle Cardiovascular:     Rate and Rhythm: Normal rate and regular rhythm.     Heart sounds: Normal heart sounds. No murmur heard.   No friction rub. No gallop.  Pulmonary:     Effort: Pulmonary effort is normal. No respiratory distress.     Breath sounds: Normal breath sounds. No wheezing or rales.  Abdominal:     General: Bowel sounds are normal.     Palpations: Abdomen is soft. There is no mass.     Tenderness: There is no abdominal tenderness. There is no guarding or rebound.  Musculoskeletal:        General: No tenderness. Normal range of motion.     Cervical back: Normal range of motion and neck supple.  Lymphadenopathy:     Cervical: No cervical adenopathy.     Right cervical: No  superficial, deep or posterior cervical adenopathy.    Left cervical: No superficial, deep or posterior cervical adenopathy.  Skin:    General: Skin is warm.     Findings: No rash.  Neurological:     Mental Status: She is alert and oriented to person, place, and time.     Cranial Nerves: No cranial nerve deficit.     Deep Tendon Reflexes: Reflexes normal.  Psychiatric:        Mood and Affect: Mood is not anxious or depressed.    Wt Readings from Last 3 Encounters:  02/15/21 232 lb (105.2 kg)  11/17/20 240 lb (108.9 kg)  01/07/20 231 lb 9.6 oz (105.1 kg)  BP 110/60    Pulse 84    Ht 5\' 1"  (1.549 m)    Wt 232 lb (105.2 kg)    BMI 43.84 kg/m   Assessment and Plan:  1. Acute maxillary sinusitis, recurrence not specified Acute.  Persistent.  Exam notes tenderness of the right maxillary sinus.  Exam and history is consistent with a right maxillary sinusitis and will treat with amoxicillin 500 mg 3 times a day for 10 days. - amoxicillin (AMOXIL) 500 MG capsule; Take 1 capsule (500 mg total) by mouth 3 (three) times daily for 10 days.  Dispense: 30 capsule; Refill: 0  2. Dysfunction of both eustachian tubes Bilateral eustachian tube dysfunction noted with retraction of both tympanic membranes.  Patient is suggested using nonsedating antihistamine along with Singulair 10 mg once a day.  Patient is also suggested use of Flonase. - montelukast (SINGULAIR) 10 MG tablet; Take 1 tablet (10 mg total) by mouth at bedtime.  Dispense: 30 tablet; Refill: 3   As per cough patient has been suggested to use Mucinex DM for the mucolytic agent and cough suppressant of the dextromethorphan.

## 2021-02-23 ENCOUNTER — Ambulatory Visit: Payer: BC Managed Care – PPO | Admitting: Internal Medicine

## 2021-02-23 ENCOUNTER — Telehealth: Payer: Self-pay

## 2021-02-23 ENCOUNTER — Encounter: Payer: Self-pay | Admitting: Family Medicine

## 2021-02-23 NOTE — Telephone Encounter (Signed)
Called patient to inform missed appointment but phone ring busy.

## 2021-02-24 ENCOUNTER — Ambulatory Visit: Payer: Self-pay

## 2021-02-24 ENCOUNTER — Ambulatory Visit
Admission: RE | Admit: 2021-02-24 | Discharge: 2021-02-24 | Disposition: A | Discharge status: Home / Self Care | Payer: BC Managed Care – PPO | Attending: Family Medicine | Admitting: Family Medicine

## 2021-02-24 ENCOUNTER — Ambulatory Visit
Admission: RE | Admit: 2021-02-24 | Discharge: 2021-02-24 | Disposition: A | Discharge status: Home / Self Care | Payer: BC Managed Care – PPO | Source: Ambulatory Visit | Attending: Family Medicine | Admitting: Family Medicine

## 2021-02-24 ENCOUNTER — Ambulatory Visit (INDEPENDENT_AMBULATORY_CARE_PROVIDER_SITE_OTHER): Payer: BC Managed Care – PPO | Admitting: Family Medicine

## 2021-02-24 ENCOUNTER — Encounter: Payer: Self-pay | Admitting: Family Medicine

## 2021-02-24 ENCOUNTER — Telehealth: Payer: Self-pay

## 2021-02-24 ENCOUNTER — Other Ambulatory Visit: Payer: Self-pay

## 2021-02-24 VITALS — BP 118/74 | HR 92 | Temp 98.6°F | Ht 61.0 in | Wt 234.0 lb

## 2021-02-24 DIAGNOSIS — R0602 Shortness of breath: Secondary | ICD-10-CM

## 2021-02-24 DIAGNOSIS — R079 Chest pain, unspecified: Secondary | ICD-10-CM

## 2021-02-24 DIAGNOSIS — R052 Subacute cough: Secondary | ICD-10-CM

## 2021-02-24 DIAGNOSIS — M940 Chondrocostal junction syndrome [Tietze]: Secondary | ICD-10-CM

## 2021-02-24 MED ORDER — IBUPROFEN 600 MG PO TABS: 600.0000 mg | ORAL_TABLET | Freq: Three times a day (TID) | ORAL | 0 refills | Status: DC | PRN

## 2021-02-24 MED ORDER — GUAIFENESIN-DM 100-10 MG/5ML PO SYRP: 5.0000 mL | ORAL_SOLUTION | ORAL | 0 refills | Status: DC | PRN

## 2021-02-24 NOTE — Telephone Encounter (Signed)
Called pt with xray results- no active cardiopulmonary disease/ lungs are clear. Explained if she is still having chest pain and/or shortness of breath, she needs to go for further eval at ER setting for chest pain and possible PE/clot in lungs. Pt voiced understanding

## 2021-02-24 NOTE — Progress Notes (Signed)
costochon   Date:  02/24/2021   Name:  Tammy Chase   DOB:  05-25-1985   MRN:  [id number]   Chief Complaint: Cough (Finished Amox today- cough is worse, lungs are hurting and SOB- clear production when can get up)  Cough This is a new problem. The current episode started 1 to 4 weeks ago (3 weeks). The problem has been waxing and waning. The cough is Non-productive. Associated symptoms include chest pain and shortness of breath. Pertinent negatives include no chills, ear pain, fever, headaches, hemoptysis, myalgias, nasal congestion, postnasal drip, rash, rhinorrhea, sore throat, sweats or wheezing. Exacerbated by: recumbency. There is no history of environmental allergies.  Chest Pain  This is a new problem. The current episode started 1 to 4 weeks ago. The onset quality is gradual. The problem has been gradually worsening. The pain is present in the lateral region. The pain is moderate. The quality of the pain is described as tightness. Radiates to: abdomen. Associated symptoms include a cough, leg pain and shortness of breath. Pertinent negatives include no abdominal pain, back pain, claudication, dizziness, exertional chest pressure, fever, headaches, hemoptysis, irregular heartbeat, lower extremity edema, malaise/fatigue, nausea, orthopnea, palpitations, PND, sputum production or weakness. The cough is Non-productive. Nothing relieves the cough.   Lab Results  Component Value Date   CREATININE 0.87 10/01/2019   GFRNONAA >60 10/01/2019   No results found for: CHOL, HDL, LDLCALC, LDLDIRECT, TRIG, CHOLHDL No results found for: TSH No results found for: HGBA1C Lab Results  Component Value Date   WBC 15.6 (H) 10/01/2019   HGB 9.6 (L) 10/01/2019   HCT 29.1 (L) 10/01/2019   MCV 86.1 10/01/2019   PLT 151 10/01/2019   No results found for: ALT, AST, GGT, ALKPHOS, BILITOT No results found for: 25OHVITD2, 25OHVITD3, VD25OH   Review of Systems  Constitutional:  Negative for chills, fever  and malaise/fatigue.  HENT:  Negative for drooling, ear discharge, ear pain, postnasal drip, rhinorrhea and sore throat.   Respiratory:  Positive for cough and shortness of breath. Negative for hemoptysis, sputum production and wheezing.   Cardiovascular:  Positive for chest pain. Negative for palpitations, orthopnea, claudication, leg swelling and PND.  Gastrointestinal:  Negative for abdominal pain, blood in stool, constipation, diarrhea and nausea.  Endocrine: Negative for polydipsia.  Genitourinary:  Negative for dysuria, frequency, hematuria and urgency.  Musculoskeletal:  Negative for back pain, myalgias and neck pain.  Skin:  Negative for rash.  Allergic/Immunologic: Negative for environmental allergies.  Neurological:  Negative for dizziness, weakness and headaches.  Hematological:  Does not bruise/bleed easily.  Psychiatric/Behavioral:  Negative for suicidal ideas. The patient is not nervous/anxious.    Patient Active Problem List   Diagnosis Date Noted   Postpartum care following cesarean delivery 10/01/2019   Single liveborn infant, delivered by cesarean 10/01/2019   Encounter for care or examination of lactating mother 10/01/2019   History of cesarean delivery 09/30/2019   Positive GBS test 09/09/2019   Third trimester bleeding 09/09/2019   Vaginal bleeding in pregnancy, third trimester 08/31/2019   Rh negative state in antepartum period 03/12/2019   Nausea and vomiting during pregnancy 03/12/2019   Supervision of high risk pregnancy, antepartum 02/27/2019   Pregnancy conceived through in vitro fertilization 02/27/2019   Obesity affecting pregnancy 02/27/2019   BMI 40.0-44.9, adult ([place]) 02/27/2019   History of cesarean delivery affecting pregnancy 02/27/2019   Infertility, anovulation 03/16/2017   Irregular menstrual bleeding 11/21/2016   Pregnancy 09/23/2014    No Known Allergies  Past Surgical History:  Procedure Laterality Date   CESAREAN SECTION N/A 09/25/2014    Procedure: CESAREAN SECTION;  Surgeon: Aletha Halim, MD;  Location: [facility];  Service: Obstetrics;  Laterality: N/A;   CESAREAN SECTION N/A 09/30/2019   Procedure: CESAREAN SECTION;  Surgeon: Will Bonnet, MD;  Location: [facility];  Service: Obstetrics;  Laterality: N/A;    Social History   Tobacco Use   Smoking status: Never   Smokeless tobacco: Never  Vaping Use   Vaping Use: Never used  Substance Use Topics   Alcohol use: Not Currently    Comment: occasional   Drug use: No     Medication list has been reviewed and updated.  Current Meds  Medication Sig   montelukast (SINGULAIR) 10 MG tablet Take 1 tablet (10 mg total) by mouth at bedtime.   SAXENDA 18 MG/3ML SOPN Inject into the skin. online    PHQ 2/9 Scores 02/15/2021 08/01/2018  PHQ - 2 Score 0 0  PHQ- 9 Score 0 2    GAD 7 : Generalized Anxiety Score 02/15/2021  Nervous, Anxious, on Edge 0  Control/stop worrying 0  Worry too much - different things 0  Trouble relaxing 0  Restless 0  Easily annoyed or irritable 0  Afraid - awful might happen 0  Total GAD 7 Score 0  Anxiety Difficulty Not difficult at all    BP Readings from Last 3 Encounters:  02/24/21 118/74  02/15/21 110/60  11/17/20 127/79    Physical Exam Vitals and nursing note reviewed.  Constitutional:      Appearance: She is well-developed.  HENT:     Head: Normocephalic.     Right Ear: Tympanic membrane, ear canal and external ear normal.     Left Ear: Tympanic membrane, ear canal and external ear normal.     Nose: No congestion or rhinorrhea.  Eyes:     General: Lids are everted, no foreign bodies appreciated. No scleral icterus.       Left eye: No foreign body or hordeolum.     Conjunctiva/sclera: Conjunctivae normal.     Right eye: Right conjunctiva is not injected.     Left eye: Left conjunctiva is not injected.     Pupils: Pupils are equal, round, and reactive to light.  Neck:     Thyroid: No thyromegaly.     Vascular: No  JVD.     Trachea: No tracheal deviation.  Cardiovascular:     Rate and Rhythm: Normal rate and regular rhythm.     Heart sounds: Normal heart sounds, S1 normal and S2 normal. No murmur heard. No systolic murmur is present.  No diastolic murmur is present.    No friction rub. No gallop. No S3 or S4 sounds.     Comments: Hx of "large calves"/ both measures 18 inches. Pulmonary:     Effort: Pulmonary effort is normal. No respiratory distress.     Breath sounds: Normal breath sounds. No decreased breath sounds, wheezing, rhonchi or rales.  Chest:     Chest wall: Tenderness present.  Breasts:    Right: Normal.     Left: Normal.       Comments: Tenderness along the upper left costochondral margins.  Also there is tenderness along the left oblique lateral chest wall.  Breath sounds are normal in this area Abdominal:     General: Bowel sounds are normal.     Palpations: Abdomen is soft. There is no hepatomegaly, splenomegaly or mass.  Tenderness: There is no abdominal tenderness. There is no guarding or rebound.  Musculoskeletal:        General: No tenderness. Normal range of motion.     Cervical back: Normal range of motion and neck supple.     Right lower leg: No edema.     Left lower leg: No edema.  Lymphadenopathy:     Cervical: No cervical adenopathy.  Skin:    General: Skin is warm.     Findings: No rash.  Neurological:     Mental Status: She is alert and oriented to person, place, and time.     Cranial Nerves: No cranial nerve deficit.     Deep Tendon Reflexes: Reflexes normal.  Psychiatric:        Mood and Affect: Mood is not anxious or depressed.    Wt Readings from Last 3 Encounters:  02/24/21 234 lb (106.1 kg)  02/15/21 232 lb (105.2 kg)  11/17/20 240 lb (108.9 kg)    BP 118/74    Pulse 99    Temp 98.6 F (37 C) (Oral)    Ht 5' 1" (1.549 m)    Wt 234 lb (106.1 kg)    SpO2 99%    BMI 44.21 kg/m   Assessment and Plan:  1. Subacute cough Relatively new onset  because she was treated for sinusitis approximately 3 weeks ago.  Patient has since developed and nonproductive cough which waxes and wanes in intensity and may come to the point that she is almost throwing up from excessive coughing.  However the actual reason for coming seems to be her concern that she is having chest pain and difficulty breathing.  Because of the persistence of the cough and the uncontrolled ability of it with OTC we have referred to pulmonary for evaluation.  Exam is unremarkable for pulmonary concern or cardiac circumstance. - Ambulatory referral to Pulmonology - guaiFENesin-dextromethorphan (ROBITUSSIN DM) 100-10 MG/5ML syrup; Take 5 mLs by mouth every 4 (four) hours as needed for cough.  Dispense: 118 mL; Refill: 0 - DG Chest 2 View; Future  2. Shortness of breath New onset.  Patient has had increasing shortness of breath at rest.  There is been no tach arrhythmia, hemoptysis, or dizziness/near syncope/syncope.  Pulmonary exam was unremarkable for rales rhonchi wheezes or rub.  We will obtain a chest x-ray.  Review of chest x-ray interpretation by radiology notes no cardiopulmonary concern. - Ambulatory referral to Pulmonology - DG Chest 2 View; Future  3. Chest pain, unspecified type New onset chest pain pain is along the left substernal area and the right lateral chest wall area.  There is tenderness in these areas.  With no auscultation concerns noted.  EKG was done and was read as the following: Rate 86 and sinus.  Intervals are normal.  No LVH met by voltage criteria.  There is no ischemic changes noted such as Q waves, ST-T wave changes, nor delay in R wave progression.  Patient will be started on ibuprofen 600 mg every 8 hours for suspected inflammation or involvement of the intercostal muscles or costochondral margins. - EKG 12-Lead - ibuprofen (ADVIL) 600 MG tablet; Take 1 tablet (600 mg total) by mouth every 8 (eight) hours as needed.  Dispense: 30 tablet; Refill: 0 -  DG Chest 2 View; Future  4. Costochondritis Patient does have tenderness along the left costochondral margins consistent with costochondritis and we will treat with ibuprofen 600 mg every 8 hours. - ibuprofen (ADVIL) 600 MG tablet; Take  1 tablet (600 mg total) by mouth every 8 (eight) hours as needed.  Dispense: 30 tablet; Refill: 0   Since review of chest x-ray patient will be contacted about concerns for the shortness of breath and chest discomfort.  We cannot rule out the possibility of embolism and patient will be called and instructed to go to the emergency room for further evaluation if she is continuing to have chest pain and difficulty breathing.

## 2021-03-07 ENCOUNTER — Telehealth: Payer: Self-pay

## 2021-03-07 NOTE — Telephone Encounter (Signed)
Copied from CRM [id number]. Topic: General - Other >> Mar 07, 2021 10:49 AM Gaetana Michaelis A wrote: Reason for CRM: The patient would like to speak with Dr. Yetta Barre or Delice Bison when possible  The patient has additional questions about their follow up visits   The patient is continuing to experience discomfort in their ribs and would like to know if they need to be seen again   The patient declined to schedule or discuss further at the time of call with agent and would like to wait to speak with a member of clinical staff first   Please contact further

## 2021-03-08 ENCOUNTER — Ambulatory Visit (INDEPENDENT_AMBULATORY_CARE_PROVIDER_SITE_OTHER): Payer: BC Managed Care – PPO | Admitting: Family Medicine

## 2021-03-08 ENCOUNTER — Encounter: Payer: Self-pay | Admitting: Family Medicine

## 2021-03-08 ENCOUNTER — Other Ambulatory Visit: Payer: Self-pay

## 2021-03-08 VITALS — BP 120/80 | HR 72 | Ht 64.0 in | Wt 231.0 lb

## 2021-03-08 DIAGNOSIS — R052 Subacute cough: Secondary | ICD-10-CM

## 2021-03-08 DIAGNOSIS — R058 Other specified cough: Secondary | ICD-10-CM

## 2021-03-08 DIAGNOSIS — M546 Pain in thoracic spine: Secondary | ICD-10-CM

## 2021-03-08 DIAGNOSIS — G588 Other specified mononeuropathies: Secondary | ICD-10-CM

## 2021-03-08 DIAGNOSIS — I8002 Phlebitis and thrombophlebitis of superficial vessels of left lower extremity: Secondary | ICD-10-CM

## 2021-03-08 DIAGNOSIS — R0602 Shortness of breath: Secondary | ICD-10-CM | POA: Diagnosis not present

## 2021-03-08 DIAGNOSIS — R0789 Other chest pain: Secondary | ICD-10-CM

## 2021-03-08 DIAGNOSIS — M79605 Pain in left leg: Secondary | ICD-10-CM

## 2021-03-08 MED ORDER — ALBUTEROL SULFATE HFA 108 (90 BASE) MCG/ACT IN AERS: 2.0000 | INHALATION_SPRAY | Freq: Four times a day (QID) | RESPIRATORY_TRACT | 2 refills | Status: DC | PRN

## 2021-03-08 MED ORDER — PREDNISONE 10 MG PO TABS: ORAL_TABLET | ORAL | 0 refills | Status: DC

## 2021-03-08 MED ORDER — MELOXICAM 15 MG PO TABS: 15.0000 mg | ORAL_TABLET | Freq: Every day | ORAL | 0 refills | Status: DC

## 2021-03-08 MED ORDER — PREDNISONE 10 MG PO TABS: 10.0000 mg | ORAL_TABLET | ORAL | 0 refills | Status: AC

## 2021-03-08 NOTE — Progress Notes (Signed)
Date:  03/08/2021   Name:  Tammy Chase   DOB:  05-Feb-1986   MRN:  [id number]   Chief Complaint: Back Pain and vein concern (L) lower leg pain and feels swollen x 1 week- hurts no matter what she is doing)  Back Pain This is a new problem. Associated symptoms include chest pain and leg pain. Pertinent negatives include no fever, headaches, numbness or weakness.  Chest Pain  This is a new (for rib pain) problem. The current episode started 1 to 4 weeks ago. The onset quality is sudden. The problem occurs constantly. The problem has been waxing and waning. The pain is present in the lateral region. The pain is at a severity of 9/10. The pain is severe. The quality of the pain is described as sharp (throbbing). Associated symptoms include back pain, a cough, leg pain and shortness of breath. Pertinent negatives include no exertional chest pressure, fever, headaches, hemoptysis, numbness or weakness. The pain is aggravated by coughing. She has tried NSAIDs for the symptoms.  Cough This is a new problem. The current episode started 1 to 4 weeks ago. The problem has been waxing and waning. The problem occurs constantly. The cough is Productive of sputum. Associated symptoms include chest pain and shortness of breath. Pertinent negatives include no fever, headaches, hemoptysis or wheezing. Associated symptoms comments: Dyspnea with cough attack. The treatment provided moderate relief.   Lab Results  Component Value Date   CREATININE 0.87 10/01/2019   GFRNONAA >60 10/01/2019   No results found for: CHOL, HDL, LDLCALC, LDLDIRECT, TRIG, CHOLHDL No results found for: TSH No results found for: HGBA1C Lab Results  Component Value Date   WBC 15.6 (H) 10/01/2019   HGB 9.6 (L) 10/01/2019   HCT 29.1 (L) 10/01/2019   MCV 86.1 10/01/2019   PLT 151 10/01/2019   No results found for: ALT, AST, GGT, ALKPHOS, BILITOT No results found for: 25OHVITD2, 25OHVITD3, VD25OH   Review of Systems   Constitutional:  Negative for fever.  Respiratory:  Positive for cough and shortness of breath. Negative for hemoptysis and wheezing.   Cardiovascular:  Positive for chest pain.  Musculoskeletal:  Positive for back pain.  Neurological:  Negative for weakness, numbness and headaches.   Patient Active Problem List   Diagnosis Date Noted   Postpartum care following cesarean delivery 10/01/2019   Single liveborn infant, delivered by cesarean 10/01/2019   Encounter for care or examination of lactating mother 10/01/2019   History of cesarean delivery 09/30/2019   Positive GBS test 09/09/2019   Third trimester bleeding 09/09/2019   Vaginal bleeding in pregnancy, third trimester 08/31/2019   Rh negative state in antepartum period 03/12/2019   Nausea and vomiting during pregnancy 03/12/2019   Supervision of high risk pregnancy, antepartum 02/27/2019   Pregnancy conceived through in vitro fertilization 02/27/2019   Obesity affecting pregnancy 02/27/2019   BMI 40.0-44.9, adult ([facility]) 02/27/2019   History of cesarean delivery affecting pregnancy 02/27/2019   Infertility, anovulation 03/16/2017   Irregular menstrual bleeding 11/21/2016   Pregnancy 09/23/2014    No Known Allergies  Past Surgical History:  Procedure Laterality Date   CESAREAN SECTION N/A 09/25/2014   Procedure: CESAREAN SECTION;  Surgeon: Tyaskin Bing, MD;  Location: [facility];  Service: Obstetrics;  Laterality: N/A;   CESAREAN SECTION N/A 09/30/2019   Procedure: CESAREAN SECTION;  Surgeon: Conard Novak, MD;  Location: [facility];  Service: Obstetrics;  Laterality: N/A;    Social History   Tobacco Use  Smoking status: Never   Smokeless tobacco: Never  Vaping Use   Vaping Use: Never used  Substance Use Topics   Alcohol use: Not Currently    Comment: occasional   Drug use: No     Medication list has been reviewed and updated.  Current Meds  Medication Sig   ibuprofen (ADVIL) 600 MG tablet Take 1 tablet  (600 mg total) by mouth every 8 (eight) hours as needed.   montelukast (SINGULAIR) 10 MG tablet Take 1 tablet (10 mg total) by mouth at bedtime.   SAXENDA 18 MG/3ML SOPN Inject into the skin. online    PHQ 2/9 Scores 02/15/2021 08/01/2018  PHQ - 2 Score 0 0  PHQ- 9 Score 0 2    GAD 7 : Generalized Anxiety Score 02/15/2021  Nervous, Anxious, on Edge 0  Control/stop worrying 0  Worry too much - different things 0  Trouble relaxing 0  Restless 0  Easily annoyed or irritable 0  Afraid - awful might happen 0  Total GAD 7 Score 0  Anxiety Difficulty Not difficult at all    BP Readings from Last 3 Encounters:  03/08/21 120/80  02/24/21 118/74  02/15/21 110/60    Physical Exam Vitals and nursing note reviewed. Exam conducted with a chaperone present.  Constitutional:      Appearance: She is well-developed.  HENT:     Head: Normocephalic.     Right Ear: Tympanic membrane and external ear normal.     Left Ear: Tympanic membrane and external ear normal.  Eyes:     General: Lids are everted, no foreign bodies appreciated. No scleral icterus.       Left eye: No foreign body or hordeolum.     Conjunctiva/sclera: Conjunctivae normal.     Right eye: Right conjunctiva is not injected.     Left eye: Left conjunctiva is not injected.     Pupils: Pupils are equal, round, and reactive to light.  Neck:     Thyroid: No thyromegaly.     Vascular: No JVD.     Trachea: No tracheal deviation.  Cardiovascular:     Rate and Rhythm: Normal rate and regular rhythm.     Heart sounds: Normal heart sounds. No murmur heard.   No friction rub. No gallop.  Pulmonary:     Effort: Pulmonary effort is normal. No respiratory distress.     Breath sounds: Normal breath sounds. No wheezing or rales.  Abdominal:     General: Bowel sounds are normal.     Palpations: Abdomen is soft. There is no mass.     Tenderness: There is no abdominal tenderness. There is no guarding or rebound.  Musculoskeletal:         General: Normal range of motion.     Cervical back: Normal range of motion and neck supple.     Thoracic back: Normal range of motion. No scoliosis.     Left lower leg: Tenderness present.       Legs:     Comments: Palpable fullness  Lymphadenopathy:     Cervical: No cervical adenopathy.  Skin:    General: Skin is warm.     Findings: No rash.  Neurological:     Mental Status: She is alert and oriented to person, place, and time.     Cranial Nerves: No cranial nerve deficit.     Deep Tendon Reflexes: Reflexes normal.  Psychiatric:        Mood and Affect: Mood is not anxious  or depressed.    Wt Readings from Last 3 Encounters:  03/08/21 231 lb (104.8 kg)  02/24/21 234 lb (106.1 kg)  02/15/21 232 lb (105.2 kg)    BP 120/80    Pulse 72    Ht 5\' 4"  (1.626 m)    Wt 231 lb (104.8 kg)    BMI 39.65 kg/m   Assessment and Plan:  1. Chest wall pain Onset shortly after Christmas with pain along the left posterior lateral to almost anterior aspect.  Tenderness along this area of approximately the eighth rib and associated intercostal space inferior.  We will obtain rib view of the above marked area as well as a thoracic spine.  And initiate meloxicam 15 mg once a day. - DG Ribs Unilateral Left; Future - DG Thoracic Spine 2 View - meloxicam (MOBIC) 15 MG tablet; Take 1 tablet (15 mg total) by mouth daily.  Dispense: 30 tablet; Refill: 0  2. Subacute cough New onset.  Persistent cough that is productive of sputum.  Although there is no wheezing there is some delay of expiratory phase consistent with bronchospasm.  We will initiate albuterol inhaler 1 to 2 puffs as needed for wheezing - albuterol (VENTOLIN HFA) 108 (90 Base) MCG/ACT inhaler; Inhale 2 puffs into the lungs every 6 (six) hours as needed for wheezing or shortness of breath.  Dispense: 8 g; Refill: 2  3. Shortness of breath As noted shortness of breath is associated with this but is usually is associated with cough. - albuterol  (VENTOLIN HFA) 108 (90 Base) MCG/ACT inhaler; Inhale 2 puffs into the lungs every 6 (six) hours as needed for wheezing or shortness of breath.  Dispense: 8 g; Refill: 2  4. Post-viral cough syndrome New onset.  Onset 1 week ago.  There is slight swelling with a - albuterol (VENTOLIN HFA) 108 (90 Base) MCG/ACT inhaler; Inhale 2 puffs into the lungs every 6 (six) hours as needed for wheezing or shortness of breath.  Dispense: 8 g; Refill: 2 - predniSONE (DELTASONE) 10 MG tablet; Take 1 tablet (10 mg total) by mouth daily with breakfast for 12 days, THEN 1 tablet (10 mg total) daily with breakfast for 12 days. Taper 4,4,4,3,3,3,2,2,2,1,1,1.  Dispense: 30 tablet; Refill: 0  5. Left leg pain Onset earlier in the week.  There is tenderness along a fullness that is elongated over the anterior medial aspect of the left lower leg.  This is consistent with superficial thrombophlebitis and we will treat with warm compresses no heating pad and anti-inflammatories. - Ambulatory referral to Vascular Surgery - meloxicam (MOBIC) 15 MG tablet; Take 1 tablet (15 mg total) by mouth daily.  Dispense: 30 tablet; Refill: 0  6. Intercostal neuralgia New onset.  Persistent.  In the event that there is a disc that is pressing on the intercostal nerve we will treat with a tapering dose of prednisone as well as meloxicam. - DG Ribs Unilateral Left; Future - DG Thoracic Spine 2 View - predniSONE (DELTASONE) 10 MG tablet; Take 1 tablet (10 mg total) by mouth daily with breakfast for 12 days, THEN 1 tablet (10 mg total) daily with breakfast for 12 days. Taper 4,4,4,3,3,3,2,2,2,1,1,1.  Dispense: 30 tablet; Refill: 0 - meloxicam (MOBIC) 15 MG tablet; Take 1 tablet (15 mg total) by mouth daily.  Dispense: 30 tablet; Refill: 0  7. Acute left-sided thoracic back pain As noted above - DG Thoracic Spine 2 View - predniSONE (DELTASONE) 10 MG tablet; Take 1 tablet (10 mg total) by mouth  daily with breakfast for 12 days, THEN 1  tablet (10 mg total) daily with breakfast for 12 days. Taper 4,4,4,3,3,3,2,2,2,1,1,1.  Dispense: 30 tablet; Refill: 0 - meloxicam (MOBIC) 15 MG tablet; Take 1 tablet (15 mg total) by mouth daily.  Dispense: 30 tablet; Refill: 0  8. Thrombophlebitis of leg, left, superficial As noted above there is an exam consistent with a thrombophlebitis that is superficial of the left lower leg.  Warm compresses anti-inflammatories and elevation is suggestion - Ambulatory referral to Vascular Surgery - meloxicam (MOBIC) 15 MG tablet; Take 1 tablet (15 mg total) by mouth daily.  Dispense: 30 tablet; Refill: 0

## 2021-03-08 NOTE — Patient Instructions (Signed)
Thrombophlebitis ?Thrombophlebitis is a condition in which a blood clot and inflammation occur inside a vein. This can happen in the arms, legs, or torso. Thrombophlebitis may involve superficial veins, deep veins, or both. Superficial veins are close to the surface of the body and are part of the superficial venous system. Veins that are deeper inside the body are part of the deep venous system. ?When this condition happens in a superficial vein (superficial thrombophlebitis), it is usually not serious.However, when the condition happens in a vein that is part of the deep venous system (deep vein thrombosis, DVT), it can cause serious problems. ?What are the causes? ?This condition may be caused by: ?Infection, injury, or trauma to a vein. ?Inflammation of the veins. ?Medical conditions that can cause blood to clot more easily (hypercoagulable state). ?Backing up, or reflux, of blood flow through the veins (chronic venous insufficiency or venous stasis). ?What increases the risk? ?The following factors may make you more likely to develop this condition: ?Having a long, thin tube (catheter) put in a vein, such as a central line, port, or IV catheter. ?Getting certain medicines through a catheter that can irritate the vein. ?Pregnancy or having recently given birth. ?Cancer. ?Obesity. ?Taking oral contraceptive pills (OCPs) or hormone therapy (HT) medicines. ?Spasms of veins. ?Immobilization, or not moving the limbs for prolonged periods. ?What are the signs or symptoms? ?The main symptoms of this condition are: ?Swelling and pain in an arm or leg. If the affected vein is in the leg, you may feel pain while standing or walking. ?Warmth or redness in an arm or leg. ?Tenderness in the affected area when it is touched. ?Other symptoms include: ?Low-grade fever. ?Muscle aches. ?A bulging or hard vein (venous distension). ?In some cases, there are no symptoms. ?How is this diagnosed? ?This condition may be diagnosed based  on: ?Your symptoms and medical history. ?A physical exam. ?Tests, such as a test that uses sound waves to make images (duplex ultrasound). ?How is this treated? ?Treatment depends on how severe the condition is and which area of the body is affected. Treatment may include: ?Applying a warm compress or heating pad to affected areas. This may need to be repeated several times a day. ?Moving the affected limb. For example, you may need to start doing walking exercises right away. You will also be encouraged to continue your usual daily activities. ?Raising (elevating) the affected arm or leg above the level of your heart. ?Medicines, such as: ?Anti-inflammatory medicines, such as ibuprofen. ?Blood thinners (anticoagulants) or anti-platelet drugs such as aspirin. ?Removing an IV or central line that may be causing the problem. ?Wearing compression stockings to help prevent blood clots and reduce swelling in your legs. ?Follow these instructions at home: ?Medicines ?Take over-the-counter and prescription medicines only as told by your health care provider. ?If you are taking blood thinners: ?Talk with your health care provider before you take any medicines that contain aspirin or NSAIDs, such as ibuprofen. These medicines increase your risk for dangerous bleeding. ?Take your medicine exactly as told, at the same time every day. ?Avoid activities that could cause injury or bruising, and follow instructions about how to prevent falls. ?Wear a medical alert bracelet or carry a card that lists what medicines you take. ?Managing pain, stiffness, and swelling ? ?If directed, apply heat to the affected area as often as told by your health care provider. Use the heat source that your health care provider recommends, such as a moist heat pack or   a heating pad. ?Place a towel between your skin and the heat source. ?Leave the heat on for 20-30 minutes. ?Remove the heat if your skin turns bright red. This is especially important if  you are unable to feel pain, heat, or cold. You have a greater risk of getting burned. ?Elevate the affected area above the level of your heart while you are sitting or lying down. ?Activity ?Return to your normal activities as told by your health care provider. Ask your health care provider what activities are safe for you. ?Avoid sitting for a long time without moving. Get up to take short walks every 1-2 hours. This is important to improve blood flow and breathing. Ask for help if you feel weak or unsteady. ?Do exercises as told by your health care provider. ?Rest as told by your health care provider. ?General instructions ?Drink enough fluid to keep your urine pale yellow. ?Wear compression stockings as told by your health care provider. ?Do not use any products that contain nicotine or tobacco. These products include cigarettes, chewing tobacco, and vaping devices, such as e-cigarettes. If you need help quitting, ask your health care provider. ?Keep all follow-up visits. This is important. ?Contact a health care provider if: ?You miss a dose of your blood thinner, if applicable. ?Your symptoms do not improve. ?You have unusual bruising. ?You have nausea, vomiting, or diarrhea that lasts for more than a day. ?Get help right away if: ?You are breathing fast or have chest pain. ?You have blood in your vomit, urine, or stool. ?You have severe pain in your affected arm or leg or new pain in any arm or leg. ?You have light-headedness, dizziness, a severe headache, or confusion. ?These symptoms may represent a serious problem that is an emergency. Do not wait to see if the symptoms will go away. Get medical help right away. Call your local emergency services (911 in the [place] Do not drive yourself to the hospital. ?Summary ?Thrombophlebitis is a condition in which a blood clot forms in a vein, causing inflammation and often pain. This can happen in both superficial and deep veins. ?The main symptom of this condition  is swelling and pain around the affected vein. Tenderness and redness may also be present. ?Treatment may include warm compresses, compression stockings, anti-inflammatory medicines, or blood thinners. ?Make sure you take all medicines, especially blood thinners, as instructed and keep all follow-up visits to ensure proper healing of the vein. ?This information is not intended to replace advice given to you by your health care provider. Make sure you discuss any questions you have with your health care provider. ?Document Revised: 07/19/2020 Document Reviewed: 07/19/2020 ?Elsevier Patient Education ? 2022 [employer]vier Inc[employer] ? ?

## 2021-03-08 NOTE — Addendum Note (Signed)
Addended by: Everitt Amber on: 03/08/2021 11:43 AM   Modules accepted: Orders

## 2021-03-09 DIAGNOSIS — I2699 Other pulmonary embolism without acute cor pulmonale: Secondary | ICD-10-CM

## 2021-03-09 DIAGNOSIS — O2230 Deep phlebothrombosis in pregnancy, unspecified trimester: Secondary | ICD-10-CM

## 2021-03-09 HISTORY — DX: Other pulmonary embolism without acute cor pulmonale: I26.99

## 2021-03-09 HISTORY — DX: Deep phlebothrombosis in pregnancy, unspecified trimester: O22.30

## 2021-03-10 ENCOUNTER — Telehealth: Payer: Self-pay

## 2021-03-10 NOTE — Telephone Encounter (Signed)
Called pt and left message concerning xray report  not received. Pt has not responded to the patient messages and there was no answer by phone. I asked that pt let us know if she has had xrays, if not please do them

## 2021-03-17 ENCOUNTER — Telehealth: Payer: Self-pay

## 2021-03-17 NOTE — Telephone Encounter (Signed)
Called and left message to come in and get xrays done that were ordered on 03/08/21

## 2021-03-28 ENCOUNTER — Other Ambulatory Visit (HOSPITAL_COMMUNITY)
Admission: RE | Admit: 2021-03-28 | Discharge: 2021-03-28 | Disposition: A | Discharge status: Home / Self Care | Payer: BC Managed Care – PPO | Source: Ambulatory Visit | Attending: Obstetrics | Admitting: Obstetrics

## 2021-03-28 ENCOUNTER — Ambulatory Visit (INDEPENDENT_AMBULATORY_CARE_PROVIDER_SITE_OTHER): Payer: BC Managed Care – PPO | Admitting: Obstetrics

## 2021-03-28 ENCOUNTER — Encounter: Payer: Self-pay | Admitting: Obstetrics

## 2021-03-28 ENCOUNTER — Other Ambulatory Visit: Payer: Self-pay

## 2021-03-28 VITALS — BP 122/70 | Wt 225.0 lb

## 2021-03-28 DIAGNOSIS — O0990 Supervision of high risk pregnancy, unspecified, unspecified trimester: Secondary | ICD-10-CM

## 2021-03-28 DIAGNOSIS — Z3480 Encounter for supervision of other normal pregnancy, unspecified trimester: Secondary | ICD-10-CM | POA: Insufficient documentation

## 2021-03-28 DIAGNOSIS — O219 Vomiting of pregnancy, unspecified: Secondary | ICD-10-CM

## 2021-03-28 DIAGNOSIS — Z8659 Personal history of other mental and behavioral disorders: Secondary | ICD-10-CM

## 2021-03-28 DIAGNOSIS — Z113 Encounter for screening for infections with a predominantly sexual mode of transmission: Secondary | ICD-10-CM

## 2021-03-28 DIAGNOSIS — O0993 Supervision of high risk pregnancy, unspecified, third trimester: Secondary | ICD-10-CM

## 2021-03-28 MED ORDER — ONDANSETRON 4 MG PO TBDP: 4.0000 mg | ORAL_TABLET | Freq: Four times a day (QID) | ORAL | 0 refills | Status: DC | PRN

## 2021-03-28 MED ORDER — ESCITALOPRAM OXALATE 10 MG PO TABS: 10.0000 mg | ORAL_TABLET | Freq: Every day | ORAL | 3 refills | Status: DC

## 2021-03-28 NOTE — Progress Notes (Signed)
NOB today. Pt not sure on LMP 

## 2021-03-28 NOTE — Progress Notes (Signed)
New Obstetric Patient H&P    Chief Complaint: "Desires prenatal care"   History of Present Illness: Patient is a 36 y.o. [id number] Not Hispanic or Latino female, LMP uncertain, presents with amenorrhea and positive home pregnancy test. Based on her  LMP, her EDD is Estimated Date of Delivery: None noted. and her EGA is Unknown. Cycles are historically irregular, and her previous pegnancies were via IVF. :  Her last pap smear was about 1 years ago and was no abnormalities.    She had a urine pregnancy test which was positive about 2 week(s)  ago. Her last menstrual period was normal and lasted for  about 5 day(s). Since her LMP she claims she has experienced no pregnancy symptoms and this is concerning for her.. She denies vaginal bleeding. Her past medical history is noncontributory. Her prior pregnancies are notable for two Cesarean sections, IVF pregnancies and pospartum depression. Tammy Chase was recently diagnosed with blood clots in one leg and in her lungs. She is presently on anticoagulants. This pregnancy is a real surprise for her and her family. She has a toddler at home , and both previous pregnancies were IVF transfers.  Since her LMP, she admits to the use of tobacco products  no She claims she has gained   no pounds since the start of her pregnancy.  There are cats in the home in the home  no She admits close contact with children on a regular basis  yes  She has had chicken pox in the past no She has had Tuberculosis exposures, symptoms, or previously tested positive for TB   no Current or past history of domestic violence. no  Genetic Screening/Teratology Counseling: (Includes patient, baby's father, or anyone in either family with:)   6. Patient's age >/= 76 at [facility]  no 2. Thalassemia ([place], [place], [place], or Asian background): MCV<80  no 3. Neural tube defect (meningomyelocele, spina bifida, anencephaly)  no 4. Congenital heart defect  no  5. Down syndrome   no 6. Tay-Sachs (Jewish, [place])  no 7. Canavan's Disease  no 8. Sickle cell disease or trait (African)  no  9. Hemophilia or other blood disorders  no  10. Muscular dystrophy  no  11. Cystic fibrosis  no  12. Huntington's Chorea  no  13. Mental retardation/autism  no 14. Other inherited genetic or chromosomal disorder  no 15. Maternal metabolic disorder (DM, PKU, etc)  no 16. Patient or FOB with a child with a birth defect not listed above no  16a. Patient or FOB with a birth defect themselves no 17. Recurrent pregnancy loss, or stillbirth  no  18. Any medications since LMP other than prenatal vitamins (include vitamins, supplements, OTC meds, drugs, alcohol)  no 19. Any other genetic/environmental exposure to discuss  no  Infection History:   1. Lives with someone with TB or TB exposed  no  2. Patient or partner has history of genital herpes  no 3. Rash or viral illness since LMP  no 4. History of STI (GC, CT, HPV, syphilis, HIV)  no 5. History of recent travel :  no  Other pertinent information:  Yes.Her previous pregnancies were IVF, and she is distressed as this is a spontaneous conception  Which her husband is questioning. He is concerned she was unfaithful, and she denies any unfaithful relationships. She is struggling with the fact she conceived without infertility assistance. She also struggled with PP depression, and is tearfully sharing that her  mood is not great today.     Review of Systems:10 point review of systems negative unless otherwise noted in HPI  Past Medical History:  Past Medical History:  Diagnosis Date   Anemia    Obesity affecting pregnancy    PCOS (polycystic ovarian syndrome)    Positive GBS test 09/09/2019   Supervision of high risk pregnancy, antepartum 02/27/2019   [facility] Prenatal Labs  Dating Day 5 embryo xfer, 7wk [place] Blood type: A/Negative/-- (01/28 0916)   Genetic Screen AFP:          NIPS: diploid XX Antibody:Negative  (01/28 0916)  Anatomic [place] complete Rubella: 3.52 (01/28 0916)  Varicella: Immune  GTT Early: 115 Third trimester: 152 3hr WNL RPR: Non Reactive (01/28 0916)   Rhogam  07/14/2019 HBsAg: Negative (01/28 0916)   TDaP vaccine   07/31/2019    Past Surgical History:  Past Surgical History:  Procedure Laterality Date   CESAREAN SECTION N/A 09/25/2014   Procedure: CESAREAN SECTION;  Surgeon: Tammy Halim, MD;  Location: [facility] ORS;  Service: Obstetrics;  Laterality: N/A;   CESAREAN SECTION N/A 09/30/2019   Procedure: CESAREAN SECTION;  Surgeon: Will Bonnet, MD;  Location: [facility] ORS;  Service: Obstetrics;  Laterality: N/A;    Gynecologic History: Patient's last menstrual period was 11/15/2020 (exact date).  Obstetric History: [id number]  Family History:  Family History  Problem Relation Age of Onset   Cancer Brother        Pancreatic   Diabetes Maternal Grandmother    Stroke Maternal Grandmother    Cancer Maternal Grandfather        Lung (smoker)    Social History:  Social History   Socioeconomic History   Marital status: Married    Spouse name: Not on file   Number of children: Not on file   Years of education: Not on file   Highest education level: Not on file  Occupational History   Not on file  Tobacco Use   Smoking status: Never   Smokeless tobacco: Never  Vaping Use   Vaping Use: Never used  Substance and Sexual Activity   Alcohol use: Not Currently    Comment: occasional   Drug use: No   Sexual activity: Yes    Comment: undecided  Other Topics Concern   Not on file  Social History Narrative   Not on file   Social Determinants of Health   Financial Resource Strain: Not on file  Food Insecurity: Not on file  Transportation Needs: Not on file  Physical Activity: Not on file  Stress: Not on file  Social Connections: Not on file  Intimate Partner Violence: Not on file    Allergies:  No Known Allergies  Medications: Prior to Admission  medications   Medication Sig Start Date End Date Taking? Authorizing Provider  enoxaparin (LOVENOX) 100 MG/ML injection Inject into the skin. 03/23/21 04/22/21 Yes [provider]  escitalopram (Tammy Chase) 10 MG tablet Take 1 tablet (10 mg total) by mouth daily. 03/28/21  Yes Imagene Riches, CNM  ondansetron (ZOFRAN-ODT) 4 MG disintegrating tablet Take 1 tablet (4 mg total) by mouth every 6 (six) hours as needed for nausea. 03/28/21  Yes Imagene Riches, CNM  albuterol (VENTOLIN HFA) 108 (90 Base) MCG/ACT inhaler Inhale 2 puffs into the lungs every 6 (six) hours as needed for wheezing or shortness of breath. Patient not taking: Reported on 03/28/2021 03/08/21   Juline Patch, MD  montelukast (SINGULAIR) 10 MG tablet Take 1  tablet (10 mg total) by mouth at bedtime. Patient not taking: Reported on 03/28/2021 02/15/21   Juline Patch, MD  SAXENDA 18 MG/3ML SOPN Inject into the skin. online Patient not taking: Reported on 03/28/2021 11/11/20   [provider]    Physical Exam Vitals: Blood pressure 122/70, weight 225 lb (102.1 kg), last menstrual period 11/15/2020, currently breastfeeding.  General: NAD HEENT: normocephalic, anicteric Thyroid: no enlargement, no palpable nodules Pulmonary: No increased work of breathing, CTAB Cardiovascular: RRR, distal pulses 2+ Abdomen: NABS, soft, non-tender, non-distended.  Umbilicus without lesions.  No hepatomegaly, splenomegaly or masses palpable. No evidence of hernia  Genitourinary:  External: Normal external female genitalia.  Normal urethral meatus, normal  Bartholin's and Skene's glands.    Vagina: Normal vaginal mucosa, no evidence of prolapse.    Cervix: Grossly normal in appearance, no bleeding  Uterus: midline, Non-enlarged, mobile, normal contour.  No CMT  Adnexa: ovaries non-enlarged, no adnexal masses  Rectal: deferred Extremities: no edema, erythema, or tenderness Neurologic: Grossly intact Psychiatric: mood appropriate,  affect full   Assessment: 36 y.o. G3P2002 at Unknown presenting to initiate prenatal care  Plan: 1) Avoid alcoholic beverages. 2) Patient encouraged not to smoke.  3) Discontinue the use of all non-medicinal drugs and chemicals.  4) Take prenatal vitamins daily.  5) Nutrition, food safety (fish, cheese advisories, and high nitrite foods) and exercise discussed. 6) Hospital and practice style discussed with cross coverage system.  7) Genetic Screening, such as with 1st Trimester Screening, cell free fetal DNA, AFP testing, and Ultrasound, as well as with amniocentesis and CVS as appropriate, is discussed with patient. At the conclusion of today's visit patient requested genetic testing 8) Patient is asked about travel to areas at risk for the Zika virus, and counseled to avoid travel and exposure to mosquitoes or sexual partners who may have themselves been exposed to the virus. Testing is discussed, and will be ordered as appropriate.  Referral to MFM made for her dating scan and consultation regarding her Lovenox.  She would like to resume Tammy Chase that she used after her last delivery- she struggled with PP depression. RX for Tammy Chase 10 mg sent. She is fond of Dr. Glennon Mac, and may request a transfer.Sheis also aware of the higher risk status of this pregnancy, and will wait to see what the MFM consult and sono tell her regarding this pregnancy.  Imagene Riches, CNM  03/28/2021 6:11 PM    Additional time spent providing emotional support. She will be referred to MFM for her dating scan and consultation.

## 2021-03-30 LAB — CERVICOVAGINAL ANCILLARY ONLY
Bacterial Vaginitis (gardnerella): POSITIVE — AB
Chlamydia: NEGATIVE
Comment: NEGATIVE
Comment: NEGATIVE
Comment: NEGATIVE
Comment: NORMAL
Neisseria Gonorrhea: NEGATIVE
Trichomonas: NEGATIVE

## 2021-03-31 LAB — URINE CULTURE

## 2021-04-04 ENCOUNTER — Encounter: Payer: Self-pay | Admitting: Obstetrics

## 2021-04-04 ENCOUNTER — Other Ambulatory Visit: Payer: Self-pay | Admitting: Obstetrics

## 2021-04-04 DIAGNOSIS — B9689 Other specified bacterial agents as the cause of diseases classified elsewhere: Secondary | ICD-10-CM

## 2021-04-04 DIAGNOSIS — N760 Acute vaginitis: Secondary | ICD-10-CM

## 2021-04-04 MED ORDER — METRONIDAZOLE 500 MG PO TABS: 500.0000 mg | ORAL_TABLET | Freq: Two times a day (BID) | ORAL | 0 refills | Status: DC

## 2021-04-06 ENCOUNTER — Ambulatory Visit: Payer: BC Managed Care – PPO | Attending: Obstetrics

## 2021-04-06 ENCOUNTER — Other Ambulatory Visit: Payer: Self-pay

## 2021-04-06 ENCOUNTER — Other Ambulatory Visit: Payer: Self-pay | Admitting: *Deleted

## 2021-04-06 ENCOUNTER — Ambulatory Visit (HOSPITAL_BASED_OUTPATIENT_CLINIC_OR_DEPARTMENT_OTHER): Payer: BC Managed Care – PPO | Admitting: Maternal & Fetal Medicine

## 2021-04-06 ENCOUNTER — Ambulatory Visit: Payer: BC Managed Care – PPO | Admitting: *Deleted

## 2021-04-06 VITALS — BP 129/83 | HR 80

## 2021-04-06 DIAGNOSIS — Z6841 Body Mass Index (BMI) 40.0 and over, adult: Secondary | ICD-10-CM | POA: Insufficient documentation

## 2021-04-06 DIAGNOSIS — O88211 Thromboembolism in pregnancy, first trimester: Secondary | ICD-10-CM | POA: Insufficient documentation

## 2021-04-06 DIAGNOSIS — Z3A10 10 weeks gestation of pregnancy: Secondary | ICD-10-CM | POA: Diagnosis not present

## 2021-04-06 DIAGNOSIS — O0990 Supervision of high risk pregnancy, unspecified, unspecified trimester: Secondary | ICD-10-CM | POA: Insufficient documentation

## 2021-04-06 DIAGNOSIS — I471 Supraventricular tachycardia: Secondary | ICD-10-CM

## 2021-04-06 NOTE — Progress Notes (Signed)
MFM Consultation ? ? ?Tammy Chase is a 36 yo G3P2 with an LMP of 11/15/20 with an EDD of 03/29/21. ?We adjusted dates to 10/30/21 given early ultrasound measuring at 10 wk 3d. ? ?She is seen at the request of Paula Compton, CNM regarding a new diagnosis of pulmonary embolism and superficial veinous thrombosis.  ? ?She was seen by hematology on 2/17 at [facility] and was started on 1 mg/kg Lovenox q12 hour for therapeutic dosing. ? ?She had a PVL Venous duplex and CTA with contrast on 2/15. There was acute superficial veinous obstruction bilaterally. Scattered emboli within the bilateral lower lobe was seen.  ? ?She is overall doing well and denies shortness of breath. ? ? ?Vitals with BMI 04/06/2021 03/28/2021 03/08/2021  ?Height - - 5\' 4"   ?Weight - 225 lbs 231 lbs  ?BMI - 38.6 39.63  ?Systolic 129 122  ?Diastolic 83 70 80  ?Pulse 80 - 72  ? ?Past Medical History:  ?Diagnosis Date  ? Anemia   ? Obesity affecting pregnancy   ? PCOS (polycystic ovarian syndrome)   ? Positive GBS test 09/09/2019  ? Supervision of high risk pregnancy, antepartum 02/27/2019  ? [facility] Prenatal Labs  Dating Day 5 embryo xfer, 7wk 03/01/2019 Blood type: A/Negative/-- (01/28 0916)   Genetic Screen AFP:          NIPS: diploid XX Antibody:Negative (01/28 0916)  Anatomic [birth year]1987 complete Rubella: 3.52 (01/28 0916)  Varicella: Immune  GTT Early: 115 Third trimester: 152 3hr WNL RPR: Non Reactive (01/28 0916)   Rhogam  07/14/2019 HBsAg: Negative (01/28 0916)   TDaP vaccine   07/31/2019  ? ?Past Surgical History:  ?Procedure Laterality Date  ? CESAREAN SECTION N/A 09/25/2014  ? Procedure: CESAREAN SECTION;  Surgeon: 09/27/2014, MD;  Location: [facility] ORS;  Service: Obstetrics;  Laterality: N/A;  ? CESAREAN SECTION N/A 09/30/2019  ? Procedure: CESAREAN SECTION;  Surgeon: 10/02/2019, MD;  Location: [facility] ORS;  Service: Obstetrics;  Laterality: N/A;  ? ?Family History  ?Problem Relation Age of Onset  ? Cancer Mother   ? Cancer Brother   ?     Pancreatic  ?  Diabetes Maternal Grandmother   ? Stroke Maternal Grandmother   ? Cancer Maternal Grandfather   ?     Lung (smoker)  ? ?Family History  ?Problem Relation Age of Onset  ? Cancer Mother   ? Cancer Brother   ?     Pancreatic  ? Diabetes Maternal Grandmother   ? Stroke Maternal Grandmother   ? Cancer Maternal Grandfather   ?     Lung (smoker)  ? ?Social History  ? ?Socioeconomic History  ? Marital status: Married  ?  Spouse name: Not on file  ? Number of children: Not on file  ? Years of education: Not on file  ? Highest education level: Not on file  ?Occupational History  ? Not on file  ?Tobacco Use  ? Smoking status: Never  ? Smokeless tobacco: Never  ?Vaping Use  ? Vaping Use: Never used  ?Substance and Sexual Activity  ? Alcohol use: Not Currently  ?  Comment: occasional  ? Drug use: Never  ? Sexual activity: Yes  ?  Comment: undecided  ?Other Topics Concern  ? Not on file  ?Social History Narrative  ? Not on file  ? ?Social Determinants of Health  ? ?Financial Resource Strain: Not on file  ?Food Insecurity: Not on file  ?Transportation Needs: Not on file  ?  Physical Activity: Not on file  ?Stress: Not on file  ?Social Connections: Not on file  ?Intimate Partner Violence: Not on file  ? ?No Known Allergies ? ? ?Imaging: ?Single intrauterine pregnancy. Early viability dated by today's exam.  ?Please see report for details. ? ?Impression: ? ?Counseling: ?Pulmonary embolism in pregnancy: ? ?Pregnancy significantly increases the risk of a venous thromboembolic event. Many substantial changes in the coagulation and vascular systems occur during pregnancy, and these changes support the accepted orthodoxy that pregnancy is a state of hypercoagulability. There is a progressive increase in the plasma concentration of several coagulation factors, including Factor VIII, and some reduction in the coagulation inhibitor protein S.  ? ?The consequences of VTE are clinically important. Untreated pulmonary embolism has a significant  mortality. Although recovery from a nonfatal pulmonary embolism may seem to be complete, pulmonary hypertension develops in a proportion of cases. DVT results in damage to venous valves and incompetence. Postphlebitic syndrome is a long term consequence. Fortunately, symptoms are usually mild, with residual swelling and some skin pigmentation. However, severe postphlebitic syndrome, with skin ulceration, is associated with significant impairment of quality of life. Most fetal risk is secondary to the maternal consequences of VTE and therapeutic interventions. For example, hypoxia and circulatory failure are consequences of major pulmonary embolism with a high likelihood of an adverse effect on placental and fetal homeostasis.  ? ?We recommend therapeutic anticoagulation to treat a PE/DVT in pregnancy. Specifically, we recommend Lovenox 1mg /kg SQ BID without transition to heparin. Lovenox efficacy should be monitored via anti-Xa levels drawn 4-6 hours after a dose, with target range of 0.6-1 IU.  ? ?Again, Therapy can be resumed 4-6 hours after a vaginal delivery or 6-12 hours after a cesarean delivery. ? ?Secondly, Ms. Wyche shared that she has had depression in the past that manifest in both anger and sadness. She was diagnosed with postpartum depression in her two prior pregnancies. She was started on Lexapro after the second pregnancy but discontinued it after 12 weeks. ? ?Today she has restarted the medication as she reports increased anger and decreased mood when she is not taking the medication.  ? ?I discussed the principle of lowest effective dose. We also discussed the association of SSRI's in general having an increased association with neonatal withdrawal. There are some registries, inconsistently reporting decreased fetal weight however, SSRI's are general not associated with fetal malformations. ? ?Given Ms. Henriquez's decreased mood and stability when not taking Lexapro I recommend she continue on the  therapy at the lowest effective dose. She understood today's discussion and had no further question. ? ?I recommend a detailed exam at 18-20 weeks and serial growth exams at 24/28/32/36 weeks. ? ?Consider antenatal testing at 34-36 weeks. ? ?With delivery around 39 weeks. She plans to have a repeat cesarean delivery. ? ?All questions answered. ? ?I spent 30 minutes with > 50% in face to face consultation. ? ?

## 2021-04-07 ENCOUNTER — Encounter: Payer: Self-pay | Admitting: Obstetrics

## 2021-04-08 ENCOUNTER — Other Ambulatory Visit: Payer: Self-pay | Admitting: Obstetrics

## 2021-04-08 DIAGNOSIS — O219 Vomiting of pregnancy, unspecified: Secondary | ICD-10-CM

## 2021-04-08 MED ORDER — ONDANSETRON 4 MG PO TBDP: 4.0000 mg | ORAL_TABLET | Freq: Four times a day (QID) | ORAL | 0 refills | Status: DC | PRN

## 2021-04-26 ENCOUNTER — Encounter: Payer: BC Managed Care – PPO | Admitting: Obstetrics

## 2021-04-27 LAB — OB RESULTS CONSOLE VARICELLA ZOSTER ANTIBODY, IGG: Varicella: IMMUNE

## 2021-04-27 LAB — OB RESULTS CONSOLE HEPATITIS B SURFACE ANTIGEN: Hepatitis B Surface Ag: NEGATIVE

## 2021-04-27 LAB — OB RESULTS CONSOLE RUBELLA ANTIBODY, IGM: Rubella: IMMUNE

## 2021-06-01 ENCOUNTER — Encounter: Payer: Self-pay | Admitting: *Deleted

## 2021-06-01 ENCOUNTER — Other Ambulatory Visit: Payer: Self-pay | Admitting: *Deleted

## 2021-06-01 ENCOUNTER — Ambulatory Visit: Payer: BC Managed Care – PPO | Attending: Maternal & Fetal Medicine

## 2021-06-01 ENCOUNTER — Ambulatory Visit: Payer: BC Managed Care – PPO | Admitting: *Deleted

## 2021-06-01 VITALS — BP 120/57 | HR 64

## 2021-06-01 DIAGNOSIS — O34219 Maternal care for unspecified type scar from previous cesarean delivery: Secondary | ICD-10-CM | POA: Diagnosis not present

## 2021-06-01 DIAGNOSIS — O99212 Obesity complicating pregnancy, second trimester: Secondary | ICD-10-CM

## 2021-06-01 DIAGNOSIS — Z86711 Personal history of pulmonary embolism: Secondary | ICD-10-CM

## 2021-06-01 DIAGNOSIS — O0990 Supervision of high risk pregnancy, unspecified, unspecified trimester: Secondary | ICD-10-CM | POA: Insufficient documentation

## 2021-06-01 DIAGNOSIS — Z362 Encounter for other antenatal screening follow-up: Secondary | ICD-10-CM

## 2021-06-01 DIAGNOSIS — I471 Supraventricular tachycardia: Secondary | ICD-10-CM

## 2021-06-01 DIAGNOSIS — Z3A18 18 weeks gestation of pregnancy: Secondary | ICD-10-CM

## 2021-06-01 DIAGNOSIS — O88211 Thromboembolism in pregnancy, first trimester: Secondary | ICD-10-CM | POA: Diagnosis present

## 2021-06-01 DIAGNOSIS — O99412 Diseases of the circulatory system complicating pregnancy, second trimester: Secondary | ICD-10-CM | POA: Diagnosis not present

## 2021-06-28 ENCOUNTER — Ambulatory Visit: Payer: BC Managed Care – PPO

## 2021-06-28 ENCOUNTER — Ambulatory Visit: Payer: BC Managed Care – PPO | Admitting: *Deleted

## 2021-06-28 ENCOUNTER — Ambulatory Visit: Payer: BC Managed Care – PPO | Attending: Maternal & Fetal Medicine

## 2021-06-28 VITALS — BP 105/52 | HR 77

## 2021-06-28 DIAGNOSIS — I471 Supraventricular tachycardia, unspecified: Secondary | ICD-10-CM

## 2021-06-28 DIAGNOSIS — Z86711 Personal history of pulmonary embolism: Secondary | ICD-10-CM

## 2021-06-28 DIAGNOSIS — O359XX0 Maternal care for (suspected) fetal abnormality and damage, unspecified, not applicable or unspecified: Secondary | ICD-10-CM | POA: Diagnosis not present

## 2021-06-28 DIAGNOSIS — O34219 Maternal care for unspecified type scar from previous cesarean delivery: Secondary | ICD-10-CM | POA: Diagnosis not present

## 2021-06-28 DIAGNOSIS — O0990 Supervision of high risk pregnancy, unspecified, unspecified trimester: Secondary | ICD-10-CM

## 2021-06-28 DIAGNOSIS — Z362 Encounter for other antenatal screening follow-up: Secondary | ICD-10-CM

## 2021-06-28 DIAGNOSIS — O99412 Diseases of the circulatory system complicating pregnancy, second trimester: Secondary | ICD-10-CM | POA: Diagnosis not present

## 2021-06-28 DIAGNOSIS — O99212 Obesity complicating pregnancy, second trimester: Secondary | ICD-10-CM

## 2021-06-28 DIAGNOSIS — Z3A22 22 weeks gestation of pregnancy: Secondary | ICD-10-CM

## 2021-06-28 DIAGNOSIS — E669 Obesity, unspecified: Secondary | ICD-10-CM

## 2021-06-29 ENCOUNTER — Other Ambulatory Visit: Payer: Self-pay | Admitting: *Deleted

## 2021-06-29 DIAGNOSIS — O99212 Obesity complicating pregnancy, second trimester: Secondary | ICD-10-CM

## 2021-06-29 DIAGNOSIS — O2232 Deep phlebothrombosis in pregnancy, second trimester: Secondary | ICD-10-CM

## 2021-06-29 DIAGNOSIS — O34219 Maternal care for unspecified type scar from previous cesarean delivery: Secondary | ICD-10-CM

## 2021-06-29 DIAGNOSIS — O88212 Thromboembolism in pregnancy, second trimester: Secondary | ICD-10-CM

## 2021-07-26 ENCOUNTER — Other Ambulatory Visit: Payer: Self-pay | Admitting: *Deleted

## 2021-07-26 ENCOUNTER — Ambulatory Visit: Payer: BC Managed Care – PPO

## 2021-07-26 ENCOUNTER — Ambulatory Visit: Payer: BC Managed Care – PPO | Admitting: *Deleted

## 2021-07-26 ENCOUNTER — Encounter: Payer: Self-pay | Admitting: *Deleted

## 2021-07-26 ENCOUNTER — Ambulatory Visit: Payer: BC Managed Care – PPO | Attending: Obstetrics

## 2021-07-26 VITALS — BP 108/60 | HR 84

## 2021-07-26 DIAGNOSIS — I82401 Acute embolism and thrombosis of unspecified deep veins of right lower extremity: Secondary | ICD-10-CM

## 2021-07-26 DIAGNOSIS — O34219 Maternal care for unspecified type scar from previous cesarean delivery: Secondary | ICD-10-CM

## 2021-07-26 DIAGNOSIS — I2699 Other pulmonary embolism without acute cor pulmonale: Secondary | ICD-10-CM | POA: Diagnosis not present

## 2021-07-26 DIAGNOSIS — E669 Obesity, unspecified: Secondary | ICD-10-CM

## 2021-07-26 DIAGNOSIS — O0990 Supervision of high risk pregnancy, unspecified, unspecified trimester: Secondary | ICD-10-CM | POA: Diagnosis present

## 2021-07-26 DIAGNOSIS — O2232 Deep phlebothrombosis in pregnancy, second trimester: Secondary | ICD-10-CM | POA: Diagnosis not present

## 2021-07-26 DIAGNOSIS — O88212 Thromboembolism in pregnancy, second trimester: Secondary | ICD-10-CM

## 2021-07-26 DIAGNOSIS — O99212 Obesity complicating pregnancy, second trimester: Secondary | ICD-10-CM

## 2021-07-26 DIAGNOSIS — Z3A26 26 weeks gestation of pregnancy: Secondary | ICD-10-CM

## 2021-07-26 DIAGNOSIS — Z6838 Body mass index (BMI) 38.0-38.9, adult: Secondary | ICD-10-CM

## 2021-07-26 DIAGNOSIS — Z86711 Personal history of pulmonary embolism: Secondary | ICD-10-CM

## 2021-07-26 DIAGNOSIS — Z86718 Personal history of other venous thrombosis and embolism: Secondary | ICD-10-CM

## 2021-07-31 ENCOUNTER — Ambulatory Visit: Payer: Self-pay

## 2021-08-02 ENCOUNTER — Telehealth: Payer: Self-pay | Admitting: Genetics

## 2021-08-17 ENCOUNTER — Other Ambulatory Visit: Payer: Self-pay | Admitting: Obstetrics

## 2021-08-17 DIAGNOSIS — D509 Iron deficiency anemia, unspecified: Secondary | ICD-10-CM

## 2021-08-17 NOTE — Progress Notes (Signed)
Maternal iron deficiency anemia in third trimester.ordered IV venofer 300 mg x 4 doses  Chari Manning CNM

## 2021-08-23 ENCOUNTER — Encounter: Payer: Self-pay | Admitting: *Deleted

## 2021-08-23 ENCOUNTER — Ambulatory Visit: Payer: BC Managed Care – PPO | Attending: Obstetrics

## 2021-08-23 ENCOUNTER — Ambulatory Visit: Payer: BC Managed Care – PPO | Admitting: *Deleted

## 2021-08-23 ENCOUNTER — Other Ambulatory Visit: Payer: Self-pay | Admitting: *Deleted

## 2021-08-23 ENCOUNTER — Ambulatory Visit
Admission: RE | Admit: 2021-08-23 | Discharge: 2021-08-23 | Disposition: A | Discharge status: Home / Self Care | Payer: BC Managed Care – PPO | Source: Ambulatory Visit | Attending: Obstetrics | Admitting: Obstetrics

## 2021-08-23 VITALS — BP 111/54 | HR 67

## 2021-08-23 DIAGNOSIS — I471 Supraventricular tachycardia: Secondary | ICD-10-CM

## 2021-08-23 DIAGNOSIS — Z3A00 Weeks of gestation of pregnancy not specified: Secondary | ICD-10-CM | POA: Insufficient documentation

## 2021-08-23 DIAGNOSIS — O0990 Supervision of high risk pregnancy, unspecified, unspecified trimester: Secondary | ICD-10-CM | POA: Insufficient documentation

## 2021-08-23 DIAGNOSIS — O09893 Supervision of other high risk pregnancies, third trimester: Secondary | ICD-10-CM

## 2021-08-23 DIAGNOSIS — O34219 Maternal care for unspecified type scar from previous cesarean delivery: Secondary | ICD-10-CM

## 2021-08-23 DIAGNOSIS — O99213 Obesity complicating pregnancy, third trimester: Secondary | ICD-10-CM

## 2021-08-23 DIAGNOSIS — E669 Obesity, unspecified: Secondary | ICD-10-CM

## 2021-08-23 DIAGNOSIS — O99013 Anemia complicating pregnancy, third trimester: Secondary | ICD-10-CM | POA: Insufficient documentation

## 2021-08-23 DIAGNOSIS — Z6838 Body mass index (BMI) 38.0-38.9, adult: Secondary | ICD-10-CM | POA: Insufficient documentation

## 2021-08-23 DIAGNOSIS — D509 Iron deficiency anemia, unspecified: Secondary | ICD-10-CM | POA: Insufficient documentation

## 2021-08-23 DIAGNOSIS — Z01818 Encounter for other preprocedural examination: Secondary | ICD-10-CM | POA: Diagnosis present

## 2021-08-23 DIAGNOSIS — Z86711 Personal history of pulmonary embolism: Secondary | ICD-10-CM | POA: Insufficient documentation

## 2021-08-23 DIAGNOSIS — Z86718 Personal history of other venous thrombosis and embolism: Secondary | ICD-10-CM

## 2021-08-23 DIAGNOSIS — Z3A30 30 weeks gestation of pregnancy: Secondary | ICD-10-CM

## 2021-08-23 DIAGNOSIS — O99413 Diseases of the circulatory system complicating pregnancy, third trimester: Secondary | ICD-10-CM | POA: Diagnosis not present

## 2021-08-23 MED ORDER — SODIUM CHLORIDE 0.9 % IV SOLN
300.0000 mg | INTRAVENOUS | Status: DC
  Administered 2021-08-23: 300 mg via INTRAVENOUS
  Filled 2021-08-23: qty 300

## 2021-08-30 ENCOUNTER — Ambulatory Visit
Admission: RE | Admit: 2021-08-30 | Discharge: 2021-08-30 | Disposition: A | Discharge status: Home / Self Care | Payer: BC Managed Care – PPO | Source: Ambulatory Visit | Attending: Obstetrics | Admitting: Obstetrics

## 2021-08-30 DIAGNOSIS — D509 Iron deficiency anemia, unspecified: Secondary | ICD-10-CM | POA: Diagnosis not present

## 2021-08-30 DIAGNOSIS — Z3A31 31 weeks gestation of pregnancy: Secondary | ICD-10-CM | POA: Diagnosis not present

## 2021-08-30 DIAGNOSIS — O99013 Anemia complicating pregnancy, third trimester: Secondary | ICD-10-CM | POA: Diagnosis not present

## 2021-08-30 MED ORDER — SODIUM CHLORIDE FLUSH 0.9 % IV SOLN
INTRAVENOUS | Status: AC
  Filled 2021-08-30: qty 10

## 2021-08-30 MED ORDER — SODIUM CHLORIDE 0.9 % IV SOLN
300.0000 mg | Freq: Once | INTRAVENOUS | Status: AC
  Administered 2021-08-30: 300 mg via INTRAVENOUS
  Filled 2021-08-30: qty 300

## 2021-09-06 ENCOUNTER — Ambulatory Visit
Admission: RE | Admit: 2021-09-06 | Discharge: 2021-09-06 | Disposition: A | Discharge status: Home / Self Care | Payer: BC Managed Care – PPO | Source: Ambulatory Visit | Attending: Obstetrics | Admitting: Obstetrics

## 2021-09-06 ENCOUNTER — Ambulatory Visit: Payer: BC Managed Care – PPO

## 2021-09-06 DIAGNOSIS — O99013 Anemia complicating pregnancy, third trimester: Secondary | ICD-10-CM | POA: Diagnosis not present

## 2021-09-06 DIAGNOSIS — Z3A00 Weeks of gestation of pregnancy not specified: Secondary | ICD-10-CM | POA: Diagnosis not present

## 2021-09-06 DIAGNOSIS — D649 Anemia, unspecified: Secondary | ICD-10-CM | POA: Insufficient documentation

## 2021-09-06 DIAGNOSIS — D696 Thrombocytopenia, unspecified: Secondary | ICD-10-CM

## 2021-09-06 HISTORY — DX: Thrombocytopenia, unspecified: D69.6

## 2021-09-06 MED ORDER — SODIUM CHLORIDE 0.9 % IV SOLN
300.0000 mg | Freq: Once | INTRAVENOUS | Status: AC
  Administered 2021-09-06: 300 mg via INTRAVENOUS
  Filled 2021-09-06: qty 15

## 2021-09-08 NOTE — Progress Notes (Signed)
Patient would have been 31.[redacted] weeks pregnant on the day of first iron infusion on 08/30/21  Chari Manning CNM

## 2021-09-13 ENCOUNTER — Ambulatory Visit: Admission: RE | Admit: 2021-09-13 | Payer: BC Managed Care – PPO | Source: Ambulatory Visit

## 2021-09-20 ENCOUNTER — Ambulatory Visit: Payer: BC Managed Care – PPO | Admitting: *Deleted

## 2021-09-20 ENCOUNTER — Encounter: Payer: Self-pay | Admitting: *Deleted

## 2021-09-20 ENCOUNTER — Ambulatory Visit: Payer: BC Managed Care – PPO | Attending: Obstetrics

## 2021-09-20 VITALS — BP 107/55 | HR 69

## 2021-09-20 DIAGNOSIS — O0990 Supervision of high risk pregnancy, unspecified, unspecified trimester: Secondary | ICD-10-CM

## 2021-09-20 DIAGNOSIS — E669 Obesity, unspecified: Secondary | ICD-10-CM

## 2021-09-20 DIAGNOSIS — Z8759 Personal history of other complications of pregnancy, childbirth and the puerperium: Secondary | ICD-10-CM | POA: Insufficient documentation

## 2021-09-20 DIAGNOSIS — O34219 Maternal care for unspecified type scar from previous cesarean delivery: Secondary | ICD-10-CM | POA: Diagnosis present

## 2021-09-20 DIAGNOSIS — Z86718 Personal history of other venous thrombosis and embolism: Secondary | ICD-10-CM | POA: Insufficient documentation

## 2021-09-20 DIAGNOSIS — O99213 Obesity complicating pregnancy, third trimester: Secondary | ICD-10-CM | POA: Diagnosis present

## 2021-09-20 DIAGNOSIS — O99891 Other specified diseases and conditions complicating pregnancy: Secondary | ICD-10-CM

## 2021-09-20 DIAGNOSIS — I471 Supraventricular tachycardia: Secondary | ICD-10-CM

## 2021-09-20 DIAGNOSIS — O99413 Diseases of the circulatory system complicating pregnancy, third trimester: Secondary | ICD-10-CM

## 2021-09-20 DIAGNOSIS — Z3A34 34 weeks gestation of pregnancy: Secondary | ICD-10-CM

## 2021-10-04 LAB — OB RESULTS CONSOLE GBS: GBS: NEGATIVE

## 2021-10-04 LAB — OB RESULTS CONSOLE GC/CHLAMYDIA
Chlamydia: NEGATIVE
Neisseria Gonorrhea: NEGATIVE

## 2021-10-04 LAB — OB RESULTS CONSOLE HIV ANTIBODY (ROUTINE TESTING): HIV: NONREACTIVE

## 2021-10-04 LAB — OB RESULTS CONSOLE RPR: RPR: NONREACTIVE

## 2021-10-11 NOTE — H&P (Signed)
Tammy Chase is a 36 y.o. female presenting for here for repeat LTCS and BTL .  Pregnancy issues :  . 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  Dx with PE and VTE 03/23/21 Meds: Therapeutic Lovenox  Referrals MFM - completed 04/06/2021 Continue therapeutic Lovenox 1mg /kg subcut BID without transition to heparin Target range of anti-Xa levles drawn 4-6 hours after a dose with target range of 0.6-1 IU Postpartum - initiate Lovenox 4-6 hours after a vaginal delivery and 6-12 hours after a cesarean delivery Antepartum management (per MFM) Detailed anatomy at 18-20 weeks  Monthly growth [place] starting at 24 weeks  Weekly antepartum testing at 34-36 weeks  Delivery - 39 weeks (repeat LTCS) Previous LTCS x 2  Scheduled for  repeat LTCS with TJS on 10/24/21 Obesity in pregnancy  Labs  03/23/21 [facility]): Cr - 0.78        AST: 34           ALT: 53 NOB: Early 1hr GTT:      A1C: 4.6 CMP:wnl          Urine PCR:188 TSH: 2.996 RH neg Rhogam at 28 weeks [x]  Antibody screen at 28 wks [x]  Rhogam PP Iron deficiency anemia  03/23/21 - hgb 9.8  04/25/21 - hgb:        10.8     Ferritin: 13.2 08/10/21: Hgb 9.4, ferritin added Iron infusions x 3 10/04/21 - hgb 11.0 Ferritin: 37 History of postpartum depression  OB History     Gravida  3   Para  2   Term  2   Preterm      AB      Living  2      SAB      IAB      Ectopic      Multiple  0   Live Births  2          Past Medical History:  Diagnosis Date   Anemia    Anxiety    Depression    Obesity affecting pregnancy    PCOS (polycystic ovarian syndrome)    Positive GBS test 09/09/2019   Supervision of high risk pregnancy, antepartum 02/27/2019   Clinic [facility] Prenatal Labs  Dating Day 5 embryo xfer, 7wk 10/06/21 Blood type: A/Negative/-- (01/28 0916)   Genetic Screen  AFP:          NIPS: diploid XX Antibody:Negative (01/28 0916)  Anatomic [place] complete Rubella: 3.52 (01/28 0916)  Varicella: Immune  GTT Early: 115 Third trimester: 152 3hr WNL RPR: Non Reactive (01/28 0916)   Rhogam  07/14/2019 HBsAg: Negative (01/28 0916)   TDaP vaccine   07/31/2019   Past Surgical History:  Procedure Laterality Date   CESAREAN SECTION N/A 09/25/2014   Procedure: CESAREAN SECTION;  Surgeon: 03-09-1985, MD;  Location: [facility] ORS;  Service: Obstetrics;  Laterality: N/A;   CESAREAN SECTION N/A 09/30/2019   Procedure: CESAREAN SECTION;  Surgeon: 09/27/2014, MD;  Location: [facility] ORS;  Service: Obstetrics;  Laterality: N/A;   Family History: family history includes Cancer in her brother, maternal grandfather, and mother; Diabetes in her maternal grandmother; Stroke in her maternal grandmother. Social History:  reports that she has never smoked. She has never used smokeless tobacco. She reports that she does not currently use alcohol. She reports  that she does not use drugs.     Medicaid Questionnaire: n/a []  ACHD Program Depression Score:11 MBT:A negative   Ab screen: Negative   HIV: neg          RPR:NR    Hep B: Negative          Hep C: NR Pap: 02/27/19 NILM      G/C: neg/neg Rubella:Immune    VZV: Immune Aneuploidy:  First trimester:  MaternitT21:   Declined Second trimester (AFP/tetra): Declined 28 weeks:  Review Medicaid Questionnaire:n/a []  ACHD Program Depression Score:13 Blood consent:Signed 08/11/21 Hgb:9.9   Platelets: 159   Glucola:144,           3 hr-WNL (78 185 14 81) Rhogam: given 08/11/21 [facility] Antibody screen: negative 36 weeks:  GBS: Negative  G/C:  Neg/Neg Hgb: 11 Platelets:  135  HIV: Neg   RPR:  NR Last 10/12/21:  04/06/2021: Single IUP seen with fetal pole; CRL -  36.7 mm, [redacted]w[redacted]d, not c/w dates; EDD by 06/06/2021 10/30/2021; FHR 166bpm; Right ovary - WNL ; Left ovary - small corpus luteum cyst; Cervix - closed  09/12/21: vertex, spine lt, ant plac, EFW- 2268 g @ 71%, AFI-  13.51 cm @ 44% Immunization:   Review of Systems Review of Systems: A full review of systems was performed and negative except as noted in the HPI.   Eyes: no vision change  Ears: left ear pain  Oropharynx: no sore throat  Pulmonary . No shortness of breath , no hemoptysis Cardiovascular: no chest pain , no irregular heart beat  Gastrointestinal:no blood in stool . No diarrhea, no constipation Uro gynecologic: no dysuria , no pelvic pain Neurologic : no seizure , no migraines    Musculoskeletal: no muscular weakness  History   currently breastfeeding. Exam Physical Exam  10/11/21 Lungs CTA   CV RRR  Abd gravid   Prenatal labs: ABO, Rh:  A neg  Antibody:  neg Rubella:  Imm / Vz : imm RPR:   NR HBsAg:   neg  HIV:   neg  GBS:   neg   Assessment/Plan: Elective repeat LTCS  and BTL on 10/24/21 she will be 39+[redacted] weeks EGA  She will stop Lovenox after her night dose before  Risks of the procedure discussed with the pt - see KC results    12/11/21 Tammy Chase 10/11/2021, 11:00 AM

## 2021-10-17 ENCOUNTER — Encounter
Admission: RE | Admit: 2021-10-17 | Discharge: 2021-10-17 | Disposition: A | Discharge status: Home / Self Care | Payer: BC Managed Care – PPO | Source: Ambulatory Visit | Attending: Obstetrics and Gynecology | Admitting: Obstetrics and Gynecology

## 2021-10-17 HISTORY — DX: Other specified postprocedural states: R11.2

## 2021-10-17 HISTORY — DX: Other specified postprocedural states: Z98.890

## 2021-10-17 NOTE — Patient Instructions (Addendum)
Your procedure is scheduled on: Monday, September 18  Arrival Time: Please call Labor and Delivery the day before your scheduled C-Section to find out your arrival time. [phone].  Arrival: If your arrival time is prior to 6:00 am, please enter through the Emergency Room Entrance and you will be directed to Labor and Delivery. If your arrival time is 6:00 am or later, please enter the Medical Mall and follow the greeter's instructions.  REMEMBER: Instructions that are not followed completely may result in serious medical risk, up to and including death; or upon the discretion of your surgeon and anesthesiologist your surgery may need to be rescheduled.  Do not eat food after midnight the night before surgery.  No gum chewing, lozengers or hard candies.  You may however, drink water up to 2 hours before you are scheduled to arrive for your surgery. Do not drink anything within 2 hours of your scheduled arrival time.   TAKE THESE MEDICATIONS THE MORNING OF SURGERY:  Lovenox injection  One week prior to surgery: Stop Anti-inflammatories (NSAIDS) such as Advil, Aleve, Ibuprofen, Motrin, Naproxen, Naprosyn and Aspirin based products such as Excedrin, Goodys Powder, BC Powder. Stop ANY OVER THE COUNTER supplements until after surgery. You may however, continue to take Tylenol if needed for pain up until the day of surgery.  No Alcohol for 24 hours before or after surgery.  No Smoking including e-cigarettes for 24 hours prior to surgery.  No chewable tobacco products for at least 6 hours prior to surgery.  No nicotine patches on the day of surgery.  Do not use any "recreational" drugs for at least a week prior to your surgery.  Please be advised that the combination of cocaine and anesthesia may have negative outcomes, up to and including death. If you test positive for cocaine, your surgery will be cancelled.  On the morning of surgery brush your teeth with toothpaste and water, you  may rinse your mouth with mouthwash if you wish. Do not swallow any toothpaste or mouthwash.  Use CHG wipes as directed on instruction sheet.  Do not wear jewelry, make-up, hairpins, clips or nail polish.  Do not wear lotions, powders, or perfumes.   Do not shave body from the neck down 48 hours prior to surgery just in case you cut yourself which could leave a site for infection.  Also, freshly shaved skin may become irritated if using the CHG soap.  Contact lenses, hearing aids and dentures may not be worn into surgery.  Do not bring valuables to the hospital. [facility] is not responsible for any missing/lost belongings or valuables.   Notify your doctor if there is any change in your medical condition (cold, fever, infection).  Wear comfortable clothing (specific to your surgery type) to the hospital.  After surgery, you can help prevent lung complications by doing breathing exercises.  Take deep breaths and cough every 1-2 hours. Your doctor may order a device called an Incentive Spirometer to help you take deep breaths. When coughing or sneezing, hold a pillow firmly against your incision with both hands. This is called "splinting." Doing this helps protect your incision. It also decreases belly discomfort.  Please call the Pre-admissions Testing Dept. at [phone] if you have any questions about these instructions.  Surgery Visitation Policy:  Support Person  The designated support person is not considered a visitor.  Any inpatient support person will be given a band identifying them as the patient's chosen support person and may stay  with the patient around the clock.  Visitor Passes   All visitors, including children, need an identification sticker when visiting. These stickers must be worn where they can be seen.   Labor & Delivery  Laboring women of any age may have one designated support person and one other visitor aged 85 and older at a time, and a doula  registered with [facility] for the labor and delivery phase of their stay. (Doulas not registered with [facility] are counted as visitors.) The visitor may switch with other visitors. Visitation is allowed 24 hours per day.  No children under the age of 8. The designated support person or a visitor may stay overnight in the room.  Mother Baby Unit, OB Specialty and Gynecological Care  A designated support person and three other visitors of any age may visit. The three visitors may switch out. The designated support person or a visitor aged 56 or older may stay overnight in the room. During the postpartum period (up to 6 weeks), if the mother is the patient, she can have her newborn stay with her if there is another support person present who can be responsible for the baby.    Preparing the Skin Before Surgery     To help prevent the risk of infection at your surgical site, we are now providing you with rinse-free Sage 2% Chlorhexidine Gluconate (CHG) disposable wipes.  The night before surgery: Shower or bathe with warm water. Do not apply perfume, lotions, powders. Wait one hour after shower. Skin should be dry and cool. Open Sage wipe package - 6 disposable cloths are inside. Wipe body using one cloth for the right arm, one cloth for the left arm, one cloth for the right leg, one cloth for the left leg, one cloth for the chest/abdomen area (do not use on breasts if breast feeding), and one cloth for the back. 5. Do not rinse, allow to dry. 6. Skin may fee "tacky" for several minutes. 7. Dress in clean clothes. 8. Place clean sheets on your bed and do not sleep with pets.  REPEAT ABOVE ON THE MORNING OF SURGERY PRIOR TO ARRIVING TO THE HOSPITAL.

## 2021-10-21 ENCOUNTER — Encounter: Payer: Self-pay | Admitting: Urgent Care

## 2021-10-21 ENCOUNTER — Encounter
Admission: RE | Admit: 2021-10-21 | Discharge: 2021-10-21 | Disposition: A | Discharge status: Home / Self Care | Payer: BC Managed Care – PPO | Source: Ambulatory Visit | Attending: Obstetrics and Gynecology | Admitting: Obstetrics and Gynecology

## 2021-10-21 DIAGNOSIS — Z01812 Encounter for preprocedural laboratory examination: Secondary | ICD-10-CM | POA: Insufficient documentation

## 2021-10-21 LAB — BASIC METABOLIC PANEL
Anion gap: 10 (ref 5–15)
BUN: 11 mg/dL (ref 6–20)
CO2: 21 mmol/L — ABNORMAL LOW (ref 22–32)
Calcium: 8.8 mg/dL — ABNORMAL LOW (ref 8.9–10.3)
Chloride: 107 mmol/L (ref 98–111)
Creatinine, Ser: 0.71 mg/dL (ref 0.44–1.00)
GFR, Estimated: >60 mL/min (ref >60)
Glucose, Bld: 104 mg/dL — ABNORMAL HIGH (ref 70–99)
Potassium: 3.8 mmol/L (ref 3.5–5.1)
Sodium: 138 mmol/L (ref 135–145)

## 2021-10-21 LAB — CBC
HCT: 33.7 % — ABNORMAL LOW (ref 36.0–46.0)
Hemoglobin: 10.8 g/dL — ABNORMAL LOW (ref 12.0–15.0)
MCH: 27.6 pg (ref 26.0–34.0)
MCHC: 32 g/dL (ref 30.0–36.0)
MCV: 86 fL (ref 80.0–100.0)
Platelets: 127 10*3/uL — ABNORMAL LOW (ref 150–400)
RBC: 3.92 MIL/uL (ref 3.87–5.11)
RDW: 18.9 % — ABNORMAL HIGH (ref 11.5–15.5)
WBC: 6.6 10*3/uL (ref 4.0–10.5)
nRBC: 0 % (ref 0.0–0.2)

## 2021-10-21 LAB — TYPE AND SCREEN
ABO/RH(D): A NEG
Antibody Screen: NEGATIVE
Extend sample reason: UNDETERMINED

## 2021-10-23 MED ORDER — ORAL CARE MOUTH RINSE: 15.0000 mL | Freq: Once | OROMUCOSAL | Status: AC

## 2021-10-23 MED ORDER — SODIUM CHLORIDE 0.9 % IV SOLN
300.0000 mg | Freq: Once | INTRAVENOUS | Status: AC
  Administered 2021-10-24: 300 mg via INTRAVENOUS
  Filled 2021-10-23: qty 300

## 2021-10-23 MED ORDER — CEFAZOLIN IN SODIUM CHLORIDE 3-0.9 GM/100ML-% IV SOLN
3.0000 g | INTRAVENOUS | Status: AC
  Administered 2021-10-24: 3 g via INTRAVENOUS
  Filled 2021-10-23: qty 100

## 2021-10-23 MED ORDER — ACETAMINOPHEN 500 MG PO TABS
1000.0000 mg | ORAL_TABLET | Freq: Once | ORAL | Status: AC
  Administered 2021-10-24: 1000 mg via ORAL
  Filled 2021-10-23: qty 2

## 2021-10-23 MED ORDER — LACTATED RINGERS IV SOLN: INTRAVENOUS | Status: DC

## 2021-10-23 MED ORDER — LACTATED RINGERS IV SOLN: Freq: Once | INTRAVENOUS | Status: AC

## 2021-10-23 MED ORDER — GABAPENTIN 600 MG PO TABS
300.0000 mg | ORAL_TABLET | Freq: Once | ORAL | Status: AC
  Administered 2021-10-24: 300 mg via ORAL
  Filled 2021-10-23: qty 0.5

## 2021-10-23 MED ORDER — CHLORHEXIDINE GLUCONATE 0.12 % MT SOLN
15.0000 mL | Freq: Once | OROMUCOSAL | Status: AC
  Administered 2021-10-24: 15 mL via OROMUCOSAL
  Filled 2021-10-23: qty 15

## 2021-10-23 MED ORDER — SOD CITRATE-CITRIC ACID 500-334 MG/5ML PO SOLN
30.0000 mL | ORAL | Status: AC
  Administered 2021-10-24: 30 mL via ORAL

## 2021-10-24 ENCOUNTER — Other Ambulatory Visit: Payer: Self-pay

## 2021-10-24 ENCOUNTER — Encounter: Payer: Self-pay | Admitting: Obstetrics and Gynecology

## 2021-10-24 ENCOUNTER — Encounter
Admission: RE | Disposition: A | Discharge status: Home / Self Care | Payer: Self-pay | Source: Home / Self Care | Attending: Obstetrics and Gynecology

## 2021-10-24 ENCOUNTER — Inpatient Hospital Stay
Admission: RE | Admit: 2021-10-24 | Discharge: 2021-10-26 | DRG: 784 | Disposition: A | Discharge status: Home / Self Care | Payer: BC Managed Care – PPO | Attending: Obstetrics and Gynecology | Admitting: Obstetrics and Gynecology

## 2021-10-24 ENCOUNTER — Inpatient Hospital Stay: Payer: BC Managed Care – PPO | Admitting: Certified Registered"

## 2021-10-24 DIAGNOSIS — O26893 Other specified pregnancy related conditions, third trimester: Secondary | ICD-10-CM | POA: Diagnosis present

## 2021-10-24 DIAGNOSIS — O34211 Maternal care for low transverse scar from previous cesarean delivery: Secondary | ICD-10-CM | POA: Diagnosis present

## 2021-10-24 DIAGNOSIS — Z7901 Long term (current) use of anticoagulants: Secondary | ICD-10-CM | POA: Diagnosis not present

## 2021-10-24 DIAGNOSIS — O9081 Anemia of the puerperium: Secondary | ICD-10-CM | POA: Diagnosis not present

## 2021-10-24 DIAGNOSIS — O9912 Other diseases of the blood and blood-forming organs and certain disorders involving the immune mechanism complicating childbirth: Secondary | ICD-10-CM | POA: Diagnosis present

## 2021-10-24 DIAGNOSIS — Z302 Encounter for sterilization: Secondary | ICD-10-CM

## 2021-10-24 DIAGNOSIS — Z3A39 39 weeks gestation of pregnancy: Secondary | ICD-10-CM | POA: Diagnosis not present

## 2021-10-24 DIAGNOSIS — D6959 Other secondary thrombocytopenia: Secondary | ICD-10-CM | POA: Diagnosis present

## 2021-10-24 DIAGNOSIS — Z86711 Personal history of pulmonary embolism: Secondary | ICD-10-CM | POA: Diagnosis not present

## 2021-10-24 DIAGNOSIS — Z6791 Unspecified blood type, Rh negative: Secondary | ICD-10-CM

## 2021-10-24 DIAGNOSIS — Z86718 Personal history of other venous thrombosis and embolism: Secondary | ICD-10-CM | POA: Diagnosis not present

## 2021-10-24 DIAGNOSIS — O99214 Obesity complicating childbirth: Secondary | ICD-10-CM | POA: Diagnosis present

## 2021-10-24 DIAGNOSIS — D62 Acute posthemorrhagic anemia: Secondary | ICD-10-CM | POA: Diagnosis not present

## 2021-10-24 DIAGNOSIS — O99824 Streptococcus B carrier state complicating childbirth: Secondary | ICD-10-CM | POA: Diagnosis present

## 2021-10-24 DIAGNOSIS — O34219 Maternal care for unspecified type scar from previous cesarean delivery: Principal | ICD-10-CM | POA: Diagnosis present

## 2021-10-24 LAB — COMPREHENSIVE METABOLIC PANEL
ALT: 45 U/L — ABNORMAL HIGH (ref 0–44)
AST: 35 U/L (ref 15–41)
Albumin: 2.8 g/dL — ABNORMAL LOW (ref 3.5–5.0)
Alkaline Phosphatase: 91 U/L (ref 38–126)
Anion gap: 7 (ref 5–15)
BUN: 11 mg/dL (ref 6–20)
CO2: 21 mmol/L — ABNORMAL LOW (ref 22–32)
Calcium: 8.6 mg/dL — ABNORMAL LOW (ref 8.9–10.3)
Chloride: 110 mmol/L (ref 98–111)
Creatinine, Ser: 0.7 mg/dL (ref 0.44–1.00)
GFR, Estimated: >60 mL/min (ref >60)
Glucose, Bld: 86 mg/dL (ref 70–99)
Potassium: 3.8 mmol/L (ref 3.5–5.1)
Sodium: 138 mmol/L (ref 135–145)
Total Bilirubin: 0.7 mg/dL (ref 0.3–1.2)
Total Protein: 6.1 g/dL — ABNORMAL LOW (ref 6.5–8.1)

## 2021-10-24 LAB — CBC
HCT: 33.2 % — ABNORMAL LOW (ref 36.0–46.0)
Hemoglobin: 10.8 g/dL — ABNORMAL LOW (ref 12.0–15.0)
MCH: 27.9 pg (ref 26.0–34.0)
MCHC: 32.5 g/dL (ref 30.0–36.0)
MCV: 85.8 fL (ref 80.0–100.0)
Platelets: 118 10*3/uL — ABNORMAL LOW (ref 150–400)
RBC: 3.87 MIL/uL (ref 3.87–5.11)
RDW: 18 % — ABNORMAL HIGH (ref 11.5–15.5)
WBC: 8.6 10*3/uL (ref 4.0–10.5)
nRBC: 0 % (ref 0.0–0.2)

## 2021-10-24 SURGERY — Surgical Case
Anesthesia: Spinal

## 2021-10-24 MED ORDER — SIMETHICONE 80 MG PO CHEW
80.0000 mg | CHEWABLE_TABLET | Freq: Three times a day (TID) | ORAL | Status: DC
  Administered 2021-10-24 – 2021-10-26 (×7): 80 mg via ORAL
  Filled 2021-10-24 (×6): qty 1

## 2021-10-24 MED ORDER — BUPIVACAINE IN DEXTROSE 0.75-8.25 % IT SOLN
INTRATHECAL | Status: DC | PRN
  Administered 2021-10-24: 1.3 mL via INTRATHECAL

## 2021-10-24 MED ORDER — SODIUM CHLORIDE 0.9% FLUSH
INTRAVENOUS | Status: DC | PRN
  Administered 2021-10-24: 20 mL via INTRADERMAL

## 2021-10-24 MED ORDER — NALOXONE HCL 0.4 MG/ML IJ SOLN: 0.4000 mg | INTRAMUSCULAR | Status: DC | PRN

## 2021-10-24 MED ORDER — TRANEXAMIC ACID-NACL 1000-0.7 MG/100ML-% IV SOLN
INTRAVENOUS | Status: AC
  Filled 2021-10-24: qty 100

## 2021-10-24 MED ORDER — ATROPINE SULFATE 1 MG/10ML IJ SOSY
PREFILLED_SYRINGE | INTRAMUSCULAR | Status: AC
  Filled 2021-10-24: qty 10

## 2021-10-24 MED ORDER — SENNOSIDES-DOCUSATE SODIUM 8.6-50 MG PO TABS
2.0000 | ORAL_TABLET | Freq: Every day | ORAL | Status: DC
  Administered 2021-10-25 – 2021-10-26 (×2): 2 via ORAL
  Filled 2021-10-24 (×2): qty 2

## 2021-10-24 MED ORDER — MORPHINE SULFATE (PF) 0.5 MG/ML IJ SOLN
INTRAMUSCULAR | Status: AC
  Filled 2021-10-24: qty 10

## 2021-10-24 MED ORDER — SODIUM CHLORIDE (PF) 0.9 % IJ SOLN
INTRAMUSCULAR | Status: AC
  Filled 2021-10-24: qty 50

## 2021-10-24 MED ORDER — WITCH HAZEL-GLYCERIN EX PADS: 1.0000 | MEDICATED_PAD | CUTANEOUS | Status: DC | PRN

## 2021-10-24 MED ORDER — OXYCODONE HCL 5 MG PO TABS
5.0000 mg | ORAL_TABLET | ORAL | Status: DC | PRN
  Administered 2021-10-25 (×3): 5 mg via ORAL
  Filled 2021-10-24 (×3): qty 1

## 2021-10-24 MED ORDER — DEXAMETHASONE SODIUM PHOSPHATE 10 MG/ML IJ SOLN
INTRAMUSCULAR | Status: AC
  Filled 2021-10-24: qty 1

## 2021-10-24 MED ORDER — GLYCOPYRROLATE PF 0.2 MG/ML IJ SOSY
PREFILLED_SYRINGE | INTRAMUSCULAR | Status: DC | PRN
  Administered 2021-10-24: .2 mg via INTRAVENOUS

## 2021-10-24 MED ORDER — ONDANSETRON HCL 4 MG/2ML IJ SOLN: 4.0000 mg | Freq: Three times a day (TID) | INTRAMUSCULAR | Status: DC | PRN

## 2021-10-24 MED ORDER — EPHEDRINE SULFATE (PRESSORS) 50 MG/ML IJ SOLN
INTRAMUSCULAR | Status: DC | PRN
  Administered 2021-10-24: 10 mg via INTRAVENOUS
  Administered 2021-10-24: 15 mg via INTRAVENOUS

## 2021-10-24 MED ORDER — GLYCOPYRROLATE 0.2 MG/ML IJ SOLN
INTRAMUSCULAR | Status: DC | PRN
  Administered 2021-10-24: .2 mg via INTRAVENOUS

## 2021-10-24 MED ORDER — ZOLPIDEM TARTRATE 5 MG PO TABS: 5.0000 mg | ORAL_TABLET | Freq: Every evening | ORAL | Status: DC | PRN

## 2021-10-24 MED ORDER — KETOROLAC TROMETHAMINE 30 MG/ML IJ SOLN: 30.0000 mg | Freq: Four times a day (QID) | INTRAMUSCULAR | Status: AC | PRN

## 2021-10-24 MED ORDER — AMMONIA AROMATIC IN INHA
RESPIRATORY_TRACT | Status: AC
  Filled 2021-10-24: qty 10

## 2021-10-24 MED ORDER — DEXAMETHASONE SODIUM PHOSPHATE 10 MG/ML IJ SOLN
INTRAMUSCULAR | Status: DC | PRN
  Administered 2021-10-24: 10 mg via INTRAVENOUS

## 2021-10-24 MED ORDER — MORPHINE SULFATE (PF) 0.5 MG/ML IJ SOLN
INTRAMUSCULAR | Status: DC | PRN
  Administered 2021-10-24: .1 mg via EPIDURAL

## 2021-10-24 MED ORDER — PHENYLEPHRINE HCL-NACL 20-0.9 MG/250ML-% IV SOLN
INTRAVENOUS | Status: DC | PRN
  Administered 2021-10-24: 100 ug/min via INTRAVENOUS
  Administered 2021-10-24: 100 ug via INTRAVENOUS

## 2021-10-24 MED ORDER — OXYTOCIN-SODIUM CHLORIDE 30-0.9 UT/500ML-% IV SOLN
INTRAVENOUS | Status: AC
  Filled 2021-10-24: qty 500

## 2021-10-24 MED ORDER — MEPERIDINE HCL 25 MG/ML IJ SOLN: 6.2500 mg | INTRAMUSCULAR | Status: DC | PRN

## 2021-10-24 MED ORDER — CARBOPROST TROMETHAMINE 250 MCG/ML IM SOLN
INTRAMUSCULAR | Status: AC
  Filled 2021-10-24: qty 1

## 2021-10-24 MED ORDER — MENTHOL 3 MG MT LOZG: 1.0000 | LOZENGE | OROMUCOSAL | Status: DC | PRN

## 2021-10-24 MED ORDER — SOD CITRATE-CITRIC ACID 500-334 MG/5ML PO SOLN
ORAL | Status: AC
  Filled 2021-10-24: qty 15

## 2021-10-24 MED ORDER — PRENATAL MULTIVITAMIN CH
1.0000 | ORAL_TABLET | Freq: Every day | ORAL | Status: DC
  Administered 2021-10-24 – 2021-10-26 (×3): 1 via ORAL
  Filled 2021-10-24 (×3): qty 1

## 2021-10-24 MED ORDER — MISOPROSTOL 200 MCG PO TABS
ORAL_TABLET | ORAL | Status: AC
  Filled 2021-10-24: qty 4

## 2021-10-24 MED ORDER — DIPHENHYDRAMINE HCL 25 MG PO CAPS: 25.0000 mg | ORAL_CAPSULE | Freq: Four times a day (QID) | ORAL | Status: DC | PRN

## 2021-10-24 MED ORDER — NALOXONE HCL 4 MG/10ML IJ SOLN: 1.0000 ug/kg/h | INTRAVENOUS | Status: DC | PRN

## 2021-10-24 MED ORDER — DIBUCAINE (PERIANAL) 1 % EX OINT: 1.0000 | TOPICAL_OINTMENT | CUTANEOUS | Status: DC | PRN

## 2021-10-24 MED ORDER — ACETAMINOPHEN 500 MG PO TABS
1000.0000 mg | ORAL_TABLET | Freq: Four times a day (QID) | ORAL | Status: DC
  Administered 2021-10-24 – 2021-10-26 (×7): 1000 mg via ORAL
  Filled 2021-10-24 (×9): qty 2

## 2021-10-24 MED ORDER — SCOPOLAMINE 1 MG/3DAYS TD PT72: 1.0000 | MEDICATED_PATCH | Freq: Once | TRANSDERMAL | Status: DC

## 2021-10-24 MED ORDER — PHENYLEPHRINE HCL-NACL 20-0.9 MG/250ML-% IV SOLN
INTRAVENOUS | Status: AC
  Filled 2021-10-24: qty 250

## 2021-10-24 MED ORDER — ENOXAPARIN SODIUM 120 MG/0.8ML IJ SOSY
1.0000 mg/kg | PREFILLED_SYRINGE | Freq: Two times a day (BID) | INTRAMUSCULAR | Status: DC
  Administered 2021-10-24 – 2021-10-26 (×4): 108 mg via SUBCUTANEOUS
  Filled 2021-10-24 (×4): qty 0.72

## 2021-10-24 MED ORDER — SODIUM CHLORIDE 0.9% FLUSH: 3.0000 mL | INTRAVENOUS | Status: DC | PRN

## 2021-10-24 MED ORDER — DIPHENHYDRAMINE HCL 50 MG/ML IJ SOLN: 12.5000 mg | INTRAMUSCULAR | Status: DC | PRN

## 2021-10-24 MED ORDER — GABAPENTIN 300 MG PO CAPS
300.0000 mg | ORAL_CAPSULE | Freq: Every day | ORAL | Status: DC
  Administered 2021-10-24 – 2021-10-26 (×2): 300 mg via ORAL
  Filled 2021-10-24 (×2): qty 1

## 2021-10-24 MED ORDER — BUPIVACAINE HCL 0.5 % IJ SOLN
INTRAMUSCULAR | Status: DC | PRN
  Administered 2021-10-24: 60 mL

## 2021-10-24 MED ORDER — OXYTOCIN-SODIUM CHLORIDE 30-0.9 UT/500ML-% IV SOLN: 2.5000 [IU]/h | INTRAVENOUS | Status: AC

## 2021-10-24 MED ORDER — FENTANYL CITRATE (PF) 100 MCG/2ML IJ SOLN
INTRAMUSCULAR | Status: DC | PRN
  Administered 2021-10-24: 15 ug via INTRATHECAL

## 2021-10-24 MED ORDER — ONDANSETRON HCL 4 MG/2ML IJ SOLN
INTRAMUSCULAR | Status: DC | PRN
  Administered 2021-10-24: 4 mg via INTRAVENOUS

## 2021-10-24 MED ORDER — EPHEDRINE 5 MG/ML INJ
INTRAVENOUS | Status: AC
  Filled 2021-10-24: qty 5

## 2021-10-24 MED ORDER — BUPIVACAINE HCL (PF) 0.5 % IJ SOLN
INTRAMUSCULAR | Status: AC
  Filled 2021-10-24: qty 60

## 2021-10-24 MED ORDER — MORPHINE SULFATE (PF) 2 MG/ML IV SOLN: 1.0000 mg | INTRAVENOUS | Status: DC | PRN

## 2021-10-24 MED ORDER — DIPHENHYDRAMINE HCL 25 MG PO CAPS: 25.0000 mg | ORAL_CAPSULE | ORAL | Status: DC | PRN

## 2021-10-24 MED ORDER — COCONUT OIL OIL: 1.0000 | TOPICAL_OIL | Status: DC | PRN

## 2021-10-24 MED ORDER — ACETAMINOPHEN 500 MG PO TABS: 1000.0000 mg | ORAL_TABLET | Freq: Four times a day (QID) | ORAL | Status: DC

## 2021-10-24 MED ORDER — SIMETHICONE 80 MG PO CHEW: 80.0000 mg | CHEWABLE_TABLET | ORAL | Status: DC | PRN

## 2021-10-24 MED ORDER — EPHEDRINE 5 MG/ML INJ
10.0000 mg | Freq: Once | INTRAVENOUS | Status: AC
  Administered 2021-10-24: 10 mg via INTRAVENOUS

## 2021-10-24 MED ORDER — METHYLERGONOVINE MALEATE 0.2 MG/ML IJ SOLN
INTRAMUSCULAR | Status: AC
  Filled 2021-10-24: qty 1

## 2021-10-24 MED ORDER — TETANUS-DIPHTH-ACELL PERTUSSIS 5-2.5-18.5 LF-MCG/0.5 IM SUSY: 0.5000 mL | PREFILLED_SYRINGE | Freq: Once | INTRAMUSCULAR | Status: DC

## 2021-10-24 MED ORDER — ONDANSETRON HCL 4 MG/2ML IJ SOLN
INTRAMUSCULAR | Status: AC
  Filled 2021-10-24: qty 2

## 2021-10-24 MED ORDER — OXYTOCIN-SODIUM CHLORIDE 30-0.9 UT/500ML-% IV SOLN
INTRAVENOUS | Status: DC | PRN
  Administered 2021-10-24: 250 mL/h via INTRAVENOUS

## 2021-10-24 MED ORDER — OXYTOCIN 10 UNIT/ML IJ SOLN
INTRAMUSCULAR | Status: AC
  Filled 2021-10-24: qty 2

## 2021-10-24 MED ORDER — FENTANYL CITRATE (PF) 100 MCG/2ML IJ SOLN
INTRAMUSCULAR | Status: AC
  Filled 2021-10-24: qty 2

## 2021-10-24 MED ORDER — LACTATED RINGERS IV BOLUS
1000.0000 mL | Freq: Once | INTRAVENOUS | Status: AC
  Administered 2021-10-24: 1000 mL via INTRAVENOUS

## 2021-10-24 SURGICAL SUPPLY — 42 items
BARRIER ADHS 3X4 INTERCEED (GAUZE/BANDAGES/DRESSINGS) ×1 IMPLANT
CHLORAPREP W/TINT 26 (MISCELLANEOUS) ×1 IMPLANT
DRESSING TELFA 8X3 (GAUZE/BANDAGES/DRESSINGS) IMPLANT
DRSG TELFA 3X8 NADH STRL (GAUZE/BANDAGES/DRESSINGS) ×1 IMPLANT
ELECT CAUTERY BLADE 6.4 (BLADE) ×1 IMPLANT
ELECT REM PT RETURN 9FT ADLT (ELECTROSURGICAL) ×1
ELECTRODE REM PT RTRN 9FT ADLT (ELECTROSURGICAL) ×1 IMPLANT
GAUZE SPONGE 4X4 12PLY STRL (GAUZE/BANDAGES/DRESSINGS) ×1 IMPLANT
GAUZE SPONGE 4X4 8PLY STRL (GAUZE/BANDAGES/DRESSINGS) IMPLANT
GLOVE SURG SYN 8.0 (GLOVE) ×1 IMPLANT
GLOVE SURG SYN 8.0 PF PI (GLOVE) ×1 IMPLANT
GOWN STRL REUS W/ TWL LRG LVL3 (GOWN DISPOSABLE) ×2 IMPLANT
GOWN STRL REUS W/ TWL XL LVL3 (GOWN DISPOSABLE) ×1 IMPLANT
GOWN STRL REUS W/TWL LRG LVL3 (GOWN DISPOSABLE) ×2
GOWN STRL REUS W/TWL XL LVL3 (GOWN DISPOSABLE) ×1
MANIFOLD NEPTUNE II (INSTRUMENTS) ×1 IMPLANT
MAT PREVALON FULL STRYKER (MISCELLANEOUS) ×1 IMPLANT
NEEDLE HYPO 22GX1.5 SAFETY (NEEDLE) ×1 IMPLANT
NS IRRIG 1000ML POUR BTL (IV SOLUTION) ×1 IMPLANT
PACK C SECTION AR (MISCELLANEOUS) ×1 IMPLANT
PAD ABD 5X9 TENDERSORB (GAUZE/BANDAGES/DRESSINGS) IMPLANT
PAD OB MATERNITY 4.3X12.25 (PERSONAL CARE ITEMS) ×1 IMPLANT
PAD PREP 24X41 OB/GYN DISP (PERSONAL CARE ITEMS) ×1 IMPLANT
RETRACTOR TRAXI PANNICULUS (MISCELLANEOUS) IMPLANT
SCRUB CHG 4% DYNA-HEX 4OZ (MISCELLANEOUS) ×1 IMPLANT
SPONGE T-LAP 18X18 ~~LOC~~+RFID (SPONGE) IMPLANT
STRAP SAFETY 5IN WIDE (MISCELLANEOUS) ×1 IMPLANT
SUCT VACUUM KIWI BELL (SUCTIONS) IMPLANT
SUT CHROMIC 1 CTX 36 (SUTURE) ×3 IMPLANT
SUT CHROMIC 2 0 CT 1 (SUTURE) IMPLANT
SUT MNCRL 4-0 (SUTURE) ×2
SUT MNCRL 4-0 27XMFL (SUTURE) ×2
SUT PLAIN GUT 0 (SUTURE) ×2 IMPLANT
SUT VIC AB 0 CT1 36 (SUTURE) ×2 IMPLANT
SUT VIC AB 2-0 CT1 (SUTURE) IMPLANT
SUT VICRYL 3-0 27IN SH (SUTURE) IMPLANT
SUTURE MNCRL 4-0 27XMF (SUTURE) IMPLANT
SYR 30ML LL (SYRINGE) ×2 IMPLANT
TAPE CAST 3X4 DELT L WHT NS LF (GAUZE/BANDAGES/DRESSINGS) IMPLANT
TRAP FLUID SMOKE EVACUATOR (MISCELLANEOUS) ×1 IMPLANT
TRAXI PANNICULUS RETRACTOR (MISCELLANEOUS) ×1
WATER STERILE IRR 500ML POUR (IV SOLUTION) ×1 IMPLANT

## 2021-10-24 NOTE — Anesthesia Preprocedure Evaluation (Addendum)
Anesthesia Evaluation  Patient identified by MRN, date of birth, ID band Patient awake    Reviewed: Allergy & Precautions, NPO status , Patient's Chart, lab work & pertinent test results  History of Anesthesia Complications (+) PONV and history of anesthetic complications (Nausea/vomiting after spinal )  Airway Mallampati: III  TM Distance: >3 FB Neck ROM: Full    Dental no notable dental hx.    Pulmonary neg pulmonary ROS,    Pulmonary exam normal breath sounds clear to auscultation       Cardiovascular negative cardio ROS Normal cardiovascular exam Rhythm:Regular Rate:Normal     Neuro/Psych PSYCHIATRIC DISORDERS Anxiety Depression negative neurological ROS     GI/Hepatic negative GI ROS, Neg liver ROS,   Endo/Other  Morbid obesity  Renal/GU negative Renal ROS  negative genitourinary   Musculoskeletal negative musculoskeletal ROS (+)   Abdominal   Peds negative pediatric ROS (+)  Hematology  (+) Blood dyscrasia, anemia , DVT/PE in March, on therapeutic lovenox, last dose 10/22/21   Anesthesia Other Findings   Reproductive/Obstetrics (+) Pregnancy and Breast feeding                            Anesthesia Physical Anesthesia Plan  ASA: 3  Anesthesia Plan: Spinal   Post-op Pain Management:    Induction:   PONV Risk Score and Plan: 3 and Ondansetron and Treatment may vary due to age or medical condition  Airway Management Planned: Natural Airway and Nasal Cannula  Additional Equipment:   Intra-op Plan:   Post-operative Plan:   Informed Consent: I have reviewed the patients History and Physical, chart, labs and discussed the procedure including the risks, benefits and alternatives for the proposed anesthesia with the patient or authorized representative who has indicated his/her understanding and acceptance.     Dental Advisory Given  Plan Discussed with: Anesthesiologist,  CRNA and Surgeon  Anesthesia Plan Comments: ( Plan for spinal with backup GA  Patient consented for risks of anesthesia including but not limited to:  - adverse reactions to medications - damage to eyes, teeth, lips or other oral mucosa - nerve damage due to positioning  - risk of bleeding, infection and or nerve damage from spinal that could lead to paralysis - risk of headache or failed spinal - damage to teeth, lips or other oral mucosa - sore throat or hoarseness - damage to heart, brain, nerves, lungs, other parts of body or loss of life  Patient voiced understanding.)       Anesthesia Quick Evaluation

## 2021-10-24 NOTE — Op Note (Signed)
Tammy Chase, Tammy Chase MEDICAL RECORD NO: [id number] ACCOUNT NO: [id number] DATE OF BIRTH: 09-21-1985 FACILITY: [facility] LOCATION: [facility] PHYSICIAN: Boykin Nearing, MD  Operative Report   DATE OF PROCEDURE: 10/24/2021  PREOPERATIVE DIAGNOSES:   1.  39+1 weeks estimated gestational age. 2.  Elective repeat cesarean section. 3.  Elective permanent sterilization.  POSTOPERATIVE DIAGNOSES:   1.  39+1 weeks estimated gestational age. 2.  Elective repeat cesarean section. 3.  Elective permanent sterilization. 4.  Vigorous female, delivered.  PROCEDURES: 1.  Repeat low transverse cesarean section -- vacuum assist. 2.  Bilateral tubal ligation.  ANESTHESIA:  Spinal.  SURGEON:  Boykin Nearing, MD  ASSISTANT:  Linda Hedges, certified nurse midwife.  INDICATIONS:  A 36 year old female status post 2 prior cesarean sections at 39+1 weeks estimated gestational age.  The patient elects for repeat cesarean section and elective permanent sterilization.  The patient has a history of a deep venous thrombosis  and pulmonary embolism in pregnancy and has been on Lovenox and this was stopped 24 hours prior to the procedure.  DESCRIPTION OF PROCEDURE:  After adequate spinal anesthesia, the patient was placed in dorsal supine position, hip roll on the right side.  The patient's abdomen was prepped and draped in normal sterile fashion.  The patient did receive 3 grams IV Ancef  prior to commencement of the case.  Timeout was performed.  A traxi pannus elevator was used to elevate the pannus cephalad.  A Pfannenstiel incision was made 2 fingerbreadths above the symphysis pubis.  Sharp dissection was used to identify the fascia.   Fascia was opened in the midline and opened in a transverse fashion.  Superior aspect of the fascia was grasped with Kocher clamps and the recti muscle was dissected free.  Inferior aspect of the fascia was grasped with Kocher clamps and pyramidalis  muscle was  dissected free.  Entry into the peritoneal cavity was accomplished sharply.  Vesicouterine peritoneal fold was identified.  There was some scarring of this area and a direct low uterine segment incision was made.  Upon entry into the  endometrial cavity, clear fluid resulted.  The incision was extended with blunt transverse traction.  Fetal head was brought to the incision and a vacuum was applied to the occiput and with one pull the head was delivered and the vacuum was removed and  the shoulders and body were delivered without difficulty.  Vigorous female was then dried on the mother's abdomen for 60 seconds.  Cord was then doubly clamped and vigorous female was passed to nursery staff who assigned Apgar scores of 9 and 10.  Fetal  weight 3110 grams. The placenta was then manually delivered and the uterus was exteriorized.  Endometrial cavity was wiped clean with laparotomy tape.  Uterine incision was then closed with #1 chromic suture in a running locking fashion, good  approximation of edges.  Good hemostasis noted.  Attention was directed to the patient's right fallopian tube, which was scarred and distorted.  Therefore, a partial distal salpingectomy was performed and pedicle was secured with 2-0 Vicryl suture.   Attention was directed to the patient's left fallopian tube, which again was scarred and distorted and a portion in the mesosalpinx was opened and the fallopian tube was doubly clamped and the intervening section of fallopian tube was removed and each  pedicle was secured with 2-0 Vicryl suture.  Posterior cul-de-sac was then irrigated and suctioned and the uterus was placed back into the abdominal cavity.  The pericolic gutters  were wiped clean with laparotomy tape.  Each tubal ligation site appeared  hemostatic.  The fascia was then closed with 0 Vicryl suture in a running nonlocking fashion, two separate sutures used.  The fascial edges were then injected with a solution of 60 mL of 0.5%  Marcaine plus 20 mL normal saline.  Approximately 50 mL of  this solution was used.  Subcutaneous tissues were irrigated and bovied for hemostasis.  There were 3 areas that were oozing that required a figure-of-eight 3-0 Vicryl sutures.  Given the depth of the subcutaneous tissues, approximately 4.5 cm, the dead  space was closed with a subcutaneous 2-0 chromic suture.  The skin was reapproximated with Insorb and 2 separate areas required subcuticular Monocryl to close the defect.  Good cosmetic effect resulted.  Additional 30 mL of Marcaine solution was injected  to the skin.  There were no complications.  QUANTITATIVE BLOOD LOSS:  810 mL  INTRAOPERATIVE FLUIDS:  450 mL  URINE OUTPUT:  150 mL.  The patient tolerated the procedure well and was taken to recovery room in good condition.   PUS D: 10/24/2021 11:01:24 am T: 10/24/2021 11:47:00 am  JOB: [id number]/ [id number]

## 2021-10-24 NOTE — Discharge Summary (Signed)
Obstetrical Discharge Summary  Patient Name: Lien Kaigler DOB: 12-17-1985 MRN: [id number]  Date of Admission: 10/24/2021 Date of Delivery: 10/24/21 Delivered by: Huel Cote MD Date of Discharge: 10/26/21  Primary OB: [facility] LMP:No LMP recorded (lmp unknown). EDC Estimated Date of Delivery: 10/30/21 Gestational Age at Delivery: [redacted]w[redacted]d   Antepartum complications: DVT and PE , on Lovenox  Admitting Diagnosis: elective repeat LTCS and BTL  Secondary Diagnosis: Patient Active Problem List   Diagnosis Date Noted   Previous cesarean delivery affecting pregnancy 10/24/2021   Supervision of high risk pregnancy, antepartum 03/28/2021   History of cesarean delivery 09/30/2019   Rh negative state in antepartum period 03/12/2019   Nausea and vomiting during pregnancy 03/12/2019   Obesity affecting pregnancy 02/27/2019   BMI 40.0-44.9, adult ([place]) 02/27/2019   History of cesarean delivery affecting pregnancy 02/27/2019   Infertility, anovulation 03/16/2017   Irregular menstrual bleeding 11/21/2016   Pregnancy 09/23/2014    Augmentation: N/A Complications: None Intrapartum complications/course:  Date of Delivery: 10/26/2021  Delivered By: Huel Cote MD Delivery Type: repeat cesarean section, low transverse incision Anesthesia: epidural Placenta: spontaneous Laceration:  Episiotomy: none Newborn Data: Live born child  weight 3110 gm  Birth Weight:  female  APGAR: , 9/10  Newborn Delivery   Birth date/time:  Delivery type: C-Section, Low Transverse Trial of labor: No C-section categorization: Repeat        Postpartum Procedures:   Edinburgh:     10/26/2021   11:56 AM 01/07/2020    8:17 AM 12/10/2019    8:48 AM 10/01/2019   10:00 AM  Edinburgh Postnatal Depression Scale Screening Tool  I have been able to laugh and see the funny side of things. 0 0 1 1  I have looked forward with enjoyment to things. 0 2 1 2   I have blamed myself unnecessarily when  things went wrong. 1 2 3 2   I have been anxious or worried for no good reason. 2 3 3 2   I have felt scared or panicky for no good reason. 2 3 2 2   Things have been getting on top of me. 1 2 2 2   I have been so unhappy that I have had difficulty sleeping. 1 1 1 1   I have felt sad or miserable. 1 2 2 1   I have been so unhappy that I have been crying. 1 2 2 1   The thought of harming myself has occurred to me. 0 0 0 1  Edinburgh Postnatal Depression Scale Total 9 17 17 15      (Cesarean Section):  Patient had an uncomplicated postpartum course.  By time of discharge on POD#2, her pain was controlled on oral pain medications; she had appropriate lochia and was ambulating, voiding without difficulty, tolerating regular diet and passing flatus.   She was deemed stable for discharge to home.    Discharge Physical Exam:  BP 110/61 (BP Location: Right Arm)   Pulse 73   Temp 97.6 F (36.4 C) (Oral)   Resp 17   Ht 5' 0.98" (1.549 m)   Wt 107.4 kg   LMP  (LMP Unknown)   SpO2 96%   Breastfeeding Unknown   BMI 44.76 kg/m   General: NAD CV: RRR Pulm: CTABL, nl effort ABD: s/nd/nt, fundus firm and below the umbilicus Lochia: moderate Incision: c/d/i DVT Evaluation: LE non-ttp, no evidence of DVT on exam.  Hemoglobin  Date Value Ref Range Status  10/25/2021 9.1 (L) 12.0 - 15.0 g/dL Final  09/04/2019 11.9  11.1 - 15.9 g/dL Final   HCT  Date Value Ref Range Status  10/25/2021 27.6 (L) 36.0 - 46.0 % Final   Hematocrit  Date Value Ref Range Status  09/04/2019 36.2 34.0 - 46.6 % Final     Disposition: stable, discharge to home. Baby Feeding: breastmilk and formula Baby Disposition: home with mom  Rh Immune globulin given:  Rubella vaccine given:  Tdap vaccine given in AP or PP setting:  Flu vaccine given in AP or PP setting:   Contraception: BTL completed   Prenatal Labs:   ABO, Rh:  A neg  Antibody:  neg Rubella:  Imm / Vz : imm RPR:   NR HBsAg:   neg  HIV:   neg  GBS:    neg   Plan:  Tenna Lacko was discharged to home in good condition. Follow-up appointment with delivering provider in 6 weeks.  Discharge Medications: LOvenox  bid Allergies as of 10/26/2021   No Known Allergies      Medication List     TAKE these medications    acetaminophen 500 MG tablet Commonly known as: TYLENOL Take 2 tablets (1,000 mg total) by mouth every 6 (six) hours.   coconut oil Oil Apply 1 Application topically as needed.   dibucaine 1 % Oint Commonly known as: NUPERCAINAL Place 1 Application rectally as needed for hemorrhoids.   enoxaparin 40 MG/0.4ML injection Commonly known as: LOVENOX Inject 0.4 mLs (40 mg total) into the skin daily. What changed:  medication strength how much to take when to take this   escitalopram 20 MG tablet Commonly known as: LEXAPRO Take 1 tablet (20 mg total) by mouth at bedtime.   ferrous sulfate 325 (65 FE) MG tablet Take 1 tablet (325 mg total) by mouth 2 (two) times daily with a meal. What changed:  medication strength how much to take when to take this   ibuprofen 600 MG tablet Commonly known as: ADVIL Take 1 tablet (600 mg total) by mouth every 6 (six) hours.   oxyCODONE 5 MG immediate release tablet Commonly known as: Oxy IR/ROXICODONE Take 1-2 tablets (5-10 mg total) by mouth every 4 (four) hours as needed for moderate pain.   prenatal multivitamin Tabs tablet Take 1 tablet by mouth daily.   senna-docusate 8.6-50 MG tablet Commonly known as: Senokot-S Take 2 tablets by mouth daily. Start taking on: October 27, 2021   simethicone 80 MG chewable tablet Commonly known as: MYLICON Chew 1 tablet (80 mg total) by mouth as needed for flatulence.   witch hazel-glycerin pad Commonly known as: TUCKS Apply 1 Application topically as needed for hemorrhoids.         Follow-up Information     Schermerhorn, Gwen Her, MD Follow up in 2 week(s).   Specialty: Obstetrics and Gynecology Why: post op check  and mood check Contact information: [address] [place] [address] [phone]                 Signed: Avelino Leeds CNM

## 2021-10-24 NOTE — Brief Op Note (Signed)
10/24/2021  10:36 AM  PATIENT:  Tammy Chase  36 y.o. female  PRE-OPERATIVE DIAGNOSIS:  repeat cesarean w/ bilateral tubal ligation  POST-OPERATIVE DIAGNOSIS:  repeat cesarean w/ bilateral tubal ligation  PROCEDURE:  Procedure(s): REPEAT CESAREAN SECTION (N/A)  SURGEON:  Surgeon(s) and Role:    * Jiovany Scheffel, Gwen Her, MD - Primary  PHYSICIAN ASSISTANT: jennifer Oxley , CNM  ASSISTANTS: none   ANESTHESIA:   spinal  EBL:  810 mL  IOF 450 cc UO 150 cc  BLOOD ADMINISTERED:none  DRAINS: Urinary Catheter (Foley)   LOCAL MEDICATIONS USED:  MARCAINE     SPECIMEN:  Source of Specimen:  portion of each fallopian tube  DISPOSITION OF SPECIMEN:  PATHOLOGY  COUNTS:  YES  TOURNIQUET:  * No tourniquets in log *  DICTATION: .Other Dictation: Dictation Number verbal  PLAN OF CARE: Admit to inpatient   PATIENT DISPOSITION:  PACU - hemodynamically stable.   Delay start of Pharmacological VTE agent (>24hrs) due to surgical blood loss or risk of bleeding: not applicable

## 2021-10-24 NOTE — Transfer of Care (Signed)
Immediate Anesthesia Transfer of Care Note  Patient: Tammy Chase  Procedure(s) Performed: REPEAT CESAREAN SECTION  Patient Location: PACU  Anesthesia Type:Spinal  Level of Consciousness: awake  Airway & Oxygen Therapy: Patient Spontanous Breathing  Post-op Assessment: Report given to RN and Post -op Vital signs reviewed and stable  Post vital signs: Reviewed and stable  Last Vitals:  Vitals Value Taken Time  BP 106/70 10/24/21 1048  Temp 35.9   Pulse 74 10/24/21 1054  Resp 14 10/24/21 1054  SpO2 95 % 10/24/21 1054  Vitals shown include unvalidated device data.  Last Pain:  Vitals:   10/24/21 0738  TempSrc: Oral  PainSc: 0-No pain         Complications: No notable events documented.

## 2021-10-24 NOTE — Anesthesia Procedure Notes (Signed)
Spinal  Patient location during procedure: OR Start time: 10/24/2021 8:42 AM End time: 10/24/2021 9:18 AM Reason for block: surgical anesthesia Staffing Performed: anesthesiologist  Anesthesiologist: Molli Barrows, MD Performed by: Cammie Sickle, CRNA Authorized by: Ilene Qua, MD   Preanesthetic Checklist Completed: patient identified, IV checked, site marked, risks and benefits discussed, surgical consent, monitors and equipment checked, pre-op evaluation and timeout performed Spinal Block Patient position: sitting Prep: Betadine Patient monitoring: heart rate, continuous pulse ox and blood pressure Approach: midline Location: L3-4 Injection technique: single-shot Needle Needle type: Whitacre and Introducer  Needle gauge: 24 G Needle length: 9 cm Assessment Events: CSF return Additional Notes Negative paresthesia. Negative blood return. Positive free-flowing CSF. Expiration date of kit checked and confirmed. Patient tolerated procedure well, without complications. I attempted x2 unsuccessfully, no complications at this time.  Dr. Danne Baxter at bedside, attempted x2 unsuccessfully, no complications at this time. Dr. Andree Elk successful on the 2nd attempt. Pt. Tolerated procedure well. No complications noted.

## 2021-10-24 NOTE — Lactation Note (Signed)
This note was copied from a baby's chart. Lactation Consultation Note  Patient Name: Tammy Chase [id number] Date: 10/24/2021 Reason for consult: Initial assessment;Term Age:34 hours  Maternal Data Has patient been taught Hand Expression?: Yes Does the patient have breastfeeding experience prior to this delivery?: Yes How long did the patient breastfeed?: 2-3 weeks  Mom is P3, repeat c-section. Mom has hx of PCOS and low milk supply with the other children and provided supplement.   Feeding Mother's Current Feeding Choice: Breast Milk and Formula  Mom desires to breastfeed, but has asked for availability of formula due to hx of low milk supply.   LATCH Score Latch: Repeated attempts needed to sustain latch, nipple held in mouth throughout feeding, stimulation needed to elicit sucking reflex.  Audible Swallowing: A few with stimulation  Type of Nipple: Everted at rest and after stimulation  Comfort (Breast/Nipple): Soft / non-tender  Hold (Positioning): No assistance needed to correctly position infant at breast.  LATCH Score: 8  Baby acting hungry, mom attempting to latch baby in cradle hold. LC assisted with slight position adjustment and sandwiching the breast tissue. Baby's face was more face down so we readjusted to provide more air flow.   Lactation Tools Discussed/Used    Interventions Interventions: Breast feeding basics reviewed;Assisted with latch;Adjust position;Education  Reviewed position/latch, alignment of baby. Encouraged use of support pillows and cross-cradle/football positions for more support of baby and achievement of deeper latch. Discussed how PCOS may affect milk supply/production. Feeding patterns in first 24 hours, hand expression pre/post feeds and between if baby is sleepy for additional stimulation, output expectations. Encouraged skin to skin time.  Discharge    Consult Status Consult Status: Follow-up    Lavonia Drafts 10/24/2021,  2:01 PM

## 2021-10-24 NOTE — Progress Notes (Signed)
Pt is ready for LTCS and BTL . Off lovenox. Labs reviewed . All questions answered . Pt reconfirms desire for BTL . Proceed

## 2021-10-24 NOTE — Progress Notes (Signed)
Patient arrived for scheduled c-section with bilateral tubal ligation. Patient placed on EFM monitor and toco to non tender area of abdomen. Consents for treatment signed. Patient oriented to care environment and Travis (FOB) at bedside.

## 2021-10-25 ENCOUNTER — Encounter: Payer: Self-pay | Admitting: Obstetrics and Gynecology

## 2021-10-25 LAB — CBC
HCT: 27.6 % — ABNORMAL LOW (ref 36.0–46.0)
Hemoglobin: 9.1 g/dL — ABNORMAL LOW (ref 12.0–15.0)
MCH: 28.3 pg (ref 26.0–34.0)
MCHC: 33 g/dL (ref 30.0–36.0)
MCV: 86 fL (ref 80.0–100.0)
Platelets: 135 10*3/uL — ABNORMAL LOW (ref 150–400)
RBC: 3.21 MIL/uL — ABNORMAL LOW (ref 3.87–5.11)
RDW: 17.6 % — ABNORMAL HIGH (ref 11.5–15.5)
WBC: 13.4 10*3/uL — ABNORMAL HIGH (ref 4.0–10.5)
nRBC: 0 % (ref 0.0–0.2)

## 2021-10-25 MED ORDER — FERROUS SULFATE 325 (65 FE) MG PO TABS
325.0000 mg | ORAL_TABLET | Freq: Two times a day (BID) | ORAL | Status: DC
  Administered 2021-10-25 – 2021-10-26 (×2): 325 mg via ORAL
  Filled 2021-10-25 (×2): qty 1

## 2021-10-25 MED ORDER — ESCITALOPRAM OXALATE 10 MG PO TABS
20.0000 mg | ORAL_TABLET | Freq: Every day | ORAL | Status: DC
  Administered 2021-10-25: 20 mg via ORAL
  Filled 2021-10-25: qty 2

## 2021-10-25 MED ORDER — IBUPROFEN 600 MG PO TABS
600.0000 mg | ORAL_TABLET | Freq: Four times a day (QID) | ORAL | Status: DC
  Administered 2021-10-26 (×3): 600 mg via ORAL
  Filled 2021-10-25 (×3): qty 1

## 2021-10-25 MED ORDER — KETOROLAC TROMETHAMINE 30 MG/ML IJ SOLN
30.0000 mg | Freq: Once | INTRAMUSCULAR | Status: AC
  Administered 2021-10-25: 30 mg via INTRAVENOUS
  Filled 2021-10-25: qty 1

## 2021-10-25 NOTE — Anesthesia Postprocedure Evaluation (Signed)
Anesthesia Post Note  Patient: Tammy Chase  Procedure(s) Performed: [place]  Patient location during evaluation: Mother Baby Anesthesia Type: Spinal Level of consciousness: awake Pain management: pain level controlled Respiratory status: spontaneous breathing Cardiovascular status: stable Postop Assessment: no headache Anesthetic complications: no   No notable events documented.   Last Vitals:  Vitals:   10/25/21 0242 10/25/21 0756  BP:  107/60  Pulse:  63  Resp:  19  Temp:  37 C  SpO2: 94% 98%    Last Pain:  Vitals:   10/25/21 0900  TempSrc:   PainSc: 5                  Lerry Liner

## 2021-10-25 NOTE — Progress Notes (Signed)
Postop Day  1  Subjective: no complaints, up ad lib, voiding, and tolerating PO  Doing well, no concerns. Ambulating without difficulty, pain managed with PO meds, tolerating regular diet, and voiding without difficulty.   No fever/chills, chest pain, shortness of breath, nausea/vomiting, or leg pain. No nipple or breast pain. No headache, visual changes, or RUQ/epigastric pain.  Objective: BP 107/60 (BP Location: Left Arm)   Pulse 63   Temp 98.6 F (37 C) (Oral)   Resp 19   Ht 5' 0.98" (1.549 m)   Wt 107.4 kg   LMP  (LMP Unknown)   SpO2 98%   Breastfeeding Unknown   BMI 44.76 kg/m    Physical Exam:  General: alert, cooperative, and no distress Breasts: soft/nontender CV: RRR Pulm: nl effort, CTABL Abdomen: soft, non-tender, active bowel sounds Uterine Fundus: firm Incision: no significant drainage Perineum: minimal edema, intact Lochia: appropriate DVT Evaluation: No evidence of DVT seen on physical exam.  Recent Labs    10/24/21 0638 10/25/21 0526  HGB 10.8* 9.1*  HCT 33.2* 27.6*  WBC 8.6 13.4*  PLT 118* 135*    Assessment/Plan: 36 y.o. G3P3003 postpartum day # 1  -Continue routine postpartum care -Lactation consult PRN for breastfeeding -Acute blood loss anemia - hemodynamically stable and asymptomatic; start PO ferrous sulfate BID with stool softeners  -Will remove dressing in shower today, change to OP site honey comb dressing  -Immunization status:   all immunizations up to date   Disposition: Continue inpatient postpartum care  LOS: 1 day   Minda Meo, CNM 10/25/2021, 9:08 AM   ----- Drinda Butts  Certified Nurse Midwife [facility]

## 2021-10-25 NOTE — Lactation Note (Signed)
This note was copied from a baby's chart. Lactation Consultation Note  Patient Name: Tammy Chase Date: 10/25/2021 Reason for consult: Follow-up assessment;Term;Mother's request;Breastfeeding assistance;RN request Age:34 hours  Maternal Data This is mom's 3rd baby, C/S elective/vacuum assisted. Mom with some experience with breastfeeding. Mom with history of PCOS, anemia, anxiety, depression, obesity, gestational thrombocytopenia. Has patient been taught Hand Expression?: Yes Does the patient have breastfeeding experience prior to this delivery?: Yes How long did the patient breastfeed?: 3 weeks Mom reports history of low milk supply.  Feeding Mother's Current Feeding Choice: Breast Milk and Formula Nipple Type: Slow - flow Assisted mom with breastfeeding. Provided mom with tips and strategies to improve latch and positioning. Baby latching with use of nipple shield(mom has used shield with last baby)given last night as baby was refusing to latch per mom.Baby latched with shield at right breast, some colostrum in the shield noted. Baby falling asleep requiring consistent tactile stimulation to keep interested in breastfeeding. At the left breast baby readily crying and detaching from the breast. Drops of formula used to encourage baby to latch. Baby did latch with audible swallows. Mom encouraged to massage the breast during the feeding.Baby achieved latch without the shield. Mom's nipples are erect.   LATCH Score Latch: Repeated attempts needed to sustain latch, nipple held in mouth throughout feeding, stimulation needed to elicit sucking reflex. (Once baby did latch at the left breast, baby sustained latch and became rythmic)  Audible Swallowing: A few with stimulation  Type of Nipple: Everted at rest and after stimulation  Comfort (Breast/Nipple): Soft / non-tender  Hold (Positioning): Assistance needed to correctly position infant at breast and maintain latch.  LATCH  Score: 7   Lactation Tools Discussed/Used Tools: Bottle Recommended mom post pump after each breastfeeding during daylight hours and during the night if the baby won't latch r/t history of low milk supply and baby now appearing flow preference as baby has been receiving formula supplement by bottle.  Interventions Interventions: Breast feeding basics reviewed;Assisted with latch;Breast massage;Adjust position;Support pillows;Position options;DEBP;Education Per mom she wants to continue with current plan. She did use an SNS with her previous child and is not opposed to using one but prefers to try overnight to work on breastfeeding. Mom will use drips at the breast as needed. Recommended mom hand express prior to latching baby and use drips if needed. Mom understands to massage her breast and provide tactile stimulation to keep baby interested when feeding.  Consult Status Consult Status: Follow-up Date: 10/26/21 Follow-up type: In-patient  Update provided to care nurse.   Tammy Chase 10/25/2021, 2:49 PM

## 2021-10-26 LAB — SURGICAL PATHOLOGY

## 2021-10-26 MED ORDER — FERROUS SULFATE 325 (65 FE) MG PO TABS: 325.0000 mg | ORAL_TABLET | Freq: Two times a day (BID) | ORAL | 3 refills | Status: AC

## 2021-10-26 MED ORDER — IBUPROFEN 600 MG PO TABS: 600.0000 mg | ORAL_TABLET | Freq: Four times a day (QID) | ORAL | 0 refills | Status: AC

## 2021-10-26 MED ORDER — SENNOSIDES-DOCUSATE SODIUM 8.6-50 MG PO TABS: 2.0000 | ORAL_TABLET | Freq: Every day | ORAL | Status: AC

## 2021-10-26 MED ORDER — OXYCODONE HCL 5 MG PO TABS: 5.0000 mg | ORAL_TABLET | ORAL | 0 refills | Status: AC | PRN

## 2021-10-26 MED ORDER — COCONUT OIL OIL: 1.0000 | TOPICAL_OIL | 0 refills | Status: AC | PRN

## 2021-10-26 MED ORDER — ACETAMINOPHEN 500 MG PO TABS: 1000.0000 mg | ORAL_TABLET | Freq: Four times a day (QID) | ORAL | 0 refills | Status: AC

## 2021-10-26 MED ORDER — ESCITALOPRAM OXALATE 20 MG PO TABS: 20.0000 mg | ORAL_TABLET | Freq: Every day | ORAL | 2 refills | Status: AC

## 2021-10-26 MED ORDER — DIBUCAINE (PERIANAL) 1 % EX OINT: 1.0000 | TOPICAL_OINTMENT | CUTANEOUS | Status: AC | PRN

## 2021-10-26 MED ORDER — ENOXAPARIN SODIUM 40 MG/0.4ML IJ SOSY: 40.0000 mg | PREFILLED_SYRINGE | INTRAMUSCULAR | 0 refills | Status: AC

## 2021-10-26 MED ORDER — SIMETHICONE 80 MG PO CHEW: 80.0000 mg | CHEWABLE_TABLET | ORAL | 0 refills | Status: AC | PRN

## 2021-10-26 MED ORDER — WITCH HAZEL-GLYCERIN EX PADS: 1.0000 | MEDICATED_PAD | CUTANEOUS | 12 refills | Status: AC | PRN

## 2021-10-26 NOTE — Clinical Social Work Maternal (Signed)
  CLINICAL SOCIAL WORK MATERNAL/CHILD NOTE  Patient Details  Name: Tammy Chase MRN: [id number] Date of Birth: 1985/06/14  Date:  10/26/2021  Clinical Social Worker Initiating Note:  Doran Clay RN BSN Case Manager Date/Time: Initiated:  10/26/21/1508     Child's Name:  Shanna Cisco   Biological Parents:  Mother, Father   Need for Interpreter:  None   Reason for Referral:  Behavioral Health Concerns   Address:  [place] [address]    Phone number:  [phone] (home)     Additional phone number: NA  Household Members/Support Persons (HM/SP):   Household Member/Support Person 1, Household Member/Support Person 2, Household Member/Support Person 3   HM/SP Name Relationship DOB or Age  HM/SP -[place] husband NA  HM/SP -2 Ovid Curd [place] daughter 7  HM/SP -[place] daughter 2  HM/SP -4        HM/SP -5        HM/SP -6        HM/SP -7        HM/SP -8          Natural Supports (not living in the home):  Parent   Professional Supports:     Employment: Animator   Type of Work: Agricultural engineer   Education:  Forensic psychologist   Homebound arranged:    Museum/gallery curator Resources:  Multimedia programmer    Other Resources:      Cultural/Religious Considerations Which May Impact Care:  NA  Strengths:  Ability to meet basic needs  , Compliance with medical plan  , Home prepared for child  , Pediatrician chosen   Psychotropic Medications:         Pediatrician:       Pediatrician List:   [place]      Pediatrician Fax Number:    Risk Factors/Current Problems:      Cognitive State:  Alert  , Goal Oriented  , Insightful     Mood/Affect:  Happy  , Calm     CSW Assessment: TOC consult for [place] a 9 history of depression and anxiety.  RNCM spoke with patient via phone, introduced self and explained reason for consult.  Patient is from home  with her husband and 2 other children.  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  She reports that about after 8 months she was able to come off the medication.  She has already spoken with her OB because she was worried that she may experience post partum again with this baby.  He increased her dosage of Lexapro.  She is familiar with what to watch for and when to seek help.  She reports having a good support system with her husband and mother.  Patient is excited to be discharged today. The home is prepared for the infant and they have all needed baby supplies.    CSW Plan/Description:  No Further Intervention Required/No Barriers to Discharge    Shelbie Hutching, RN 10/26/2021, 3:10 PM

## 2021-10-26 NOTE — Progress Notes (Signed)
Patient responsive to teaching. No questions at this time.

## 2021-11-03 ENCOUNTER — Ambulatory Visit: Payer: BC Managed Care – PPO | Attending: Anesthesiology | Admitting: Anesthesiology

## 2021-11-03 ENCOUNTER — Encounter: Payer: Self-pay | Admitting: Anesthesiology

## 2021-11-03 ENCOUNTER — Other Ambulatory Visit: Payer: Self-pay

## 2021-11-03 VITALS — BP 90/67 | HR 69 | Temp 97.9°F | Resp 18 | Ht 61.0 in | Wt 220.0 lb

## 2021-11-03 DIAGNOSIS — G971 Other reaction to spinal and lumbar puncture: Secondary | ICD-10-CM

## 2021-11-03 NOTE — Progress Notes (Signed)
Safety precautions to be maintained throughout the outpatient stay will include: orient to surroundings, keep bed in low position, maintain call bell within reach at all times, provide assistance with transfer out of bed and ambulation.  

## 2021-11-04 ENCOUNTER — Encounter: Payer: Self-pay | Admitting: Anesthesiology

## 2021-11-04 NOTE — Progress Notes (Signed)
Subjective:  Patient ID: Tammy Chase, female    DOB: [birth year]  Age: 36 y.o. MRN: [id number]  CC: Headache (10 days postpartum)     PROCEDURE: None  HPI Tammy Chase presents for new patient evaluation referred by Dr. Penelope Galas for evaluation.  Approximately a week or so ago she had a spinal placed for cesarean section.  This was difficult placement per her recall and now presents with a positional headache of new onset.  The headache is worse when she is seated or standing for any prolonged period of time.  Is generally relatively tolerable.  She is not taking any of her anti-inflammatories at this time as she was instructed to take Lovenox but discontinued this 72 hours ago.  The headache seems to be better when she is lying recumbent.  She also has some mild photophobia per description which is generally tolerable.  No neck stiffness no fever chills nausea vomiting or upper or lower extremity weakness is reported.  When she is supine she is headache free.  History Tammy Chase has a past medical history of Anemia, Anxiety, Depression, DVT (deep vein thrombosis) in pregnancy (03/2021), Gestational thrombocytopenia ([place]) (09/2021), Obesity affecting pregnancy, PCOS (polycystic ovarian syndrome), PONV (postoperative nausea and vomiting), Positive GBS test (09/09/2019), Pulmonary embolism ([place]) (03/2021), and Supervision of high risk pregnancy, antepartum (02/27/2019).   She has a past surgical history that includes Cesarean section (N/A, 09/25/2014); Cesarean section (N/A, 09/30/2019); and Cesarean section (N/A, 10/24/2021).   Her family history includes Cancer in her brother, maternal grandfather, and mother; Diabetes in her maternal grandmother; Stroke in her maternal grandmother.She reports that she has never smoked. She has never used smokeless tobacco. She reports that she does not currently use alcohol. She reports that she does not use drugs.  No valid procedures specified.  No  results found for: "TOXASSSELUR"  Outpatient Medications Prior to Visit  Medication Sig Dispense Refill   acetaminophen (TYLENOL) 500 MG tablet Take 2 tablets (1,000 mg total) by mouth every 6 (six) hours. 30 tablet 0   coconut oil OIL Apply 1 Application topically as needed.  0   dibucaine (NUPERCAINAL) 1 % OINT Place 1 Application rectally as needed for hemorrhoids.     enoxaparin (LOVENOX) 40 MG/0.4ML injection Inject 0.4 mLs (40 mg total) into the skin daily. 16.8 mL 0   oxyCODONE (OXY IR/ROXICODONE) 5 MG immediate release tablet Take 1-2 tablets (5-10 mg total) by mouth every 4 (four) hours as needed for moderate pain. 30 tablet 0   Prenatal Vit-Fe Fumarate-FA (PRENATAL MULTIVITAMIN) TABS tablet Take 1 tablet by mouth daily.     escitalopram (LEXAPRO) 20 MG tablet Take 1 tablet (20 mg total) by mouth at bedtime. (Patient not taking: Reported on 11/03/2021) 90 tablet 2   ferrous sulfate 325 (65 FE) MG tablet Take 1 tablet (325 mg total) by mouth 2 (two) times daily with a meal. (Patient not taking: Reported on 11/03/2021)  3   ibuprofen (ADVIL) 600 MG tablet Take 1 tablet (600 mg total) by mouth every 6 (six) hours. (Patient not taking: Reported on 11/03/2021) 30 tablet 0   senna-docusate (SENOKOT-S) 8.6-50 MG tablet Take 2 tablets by mouth daily. (Patient not taking: Reported on 11/03/2021)     simethicone (MYLICON) 80 MG chewable tablet Chew 1 tablet (80 mg total) by mouth as needed for flatulence. (Patient not taking: Reported on 11/03/2021) 30 tablet 0   witch hazel-glycerin (TUCKS) pad Apply 1 Application topically as needed for hemorrhoids. (Patient not  taking: Reported on 11/03/2021) 40 each 12   No facility-administered medications prior to visit.   Lab Results  Component Value Date   WBC 13.4 (H) 10/25/2021   HGB 9.1 (L) 10/25/2021   HCT 27.6 (L) 10/25/2021   PLT 135 (L) 10/25/2021   GLUCOSE 86 10/24/2021   ALT 45 (H) 10/24/2021   AST 35 10/24/2021   NA 138 10/24/2021   K 3.8  10/24/2021   CL 110 10/24/2021   CREATININE 0.70 10/24/2021   BUN 11 10/24/2021   CO2 21 (L) 10/24/2021   INR 1.1 09/09/2019   GLUF 75 07/31/2019    --------------------------------------------------------------------------------------------------------------------- No results found.     ---------------------------------------------------------------------------------------------------------------------- Past Medical History:  Diagnosis Date   Anemia    Anxiety    Depression    DVT (deep vein thrombosis) in pregnancy 03/2021   Gestational thrombocytopenia ([facility]) 09/2021   Obesity affecting pregnancy    PCOS (polycystic ovarian syndrome)    PONV (postoperative nausea and vomiting)    Positive GBS test 09/09/2019   Pulmonary embolism ([place]) 03/2021   Supervision of high risk pregnancy, antepartum 02/27/2019   Clinic [facility] Prenatal Labs  Dating Day 5 embryo xfer, 7wk [place] Blood type: A/Negative/-- (01/28 0916)   Genetic Screen AFP:          NIPS: diploid XX Antibody:Negative (01/28 0916)  Anatomic [place] complete Rubella: 3.52 (01/28 0916)  Varicella: Immune  GTT Early: 115 Third trimester: 152 3hr WNL RPR: Non Reactive (01/28 0916)   Rhogam  07/14/2019 HBsAg: Negative (01/28 0916)   TDaP vaccine   07/31/2019    Past Surgical History:  Procedure Laterality Date   CESAREAN SECTION N/A 09/25/2014   Procedure: CESAREAN SECTION;  Surgeon: Aletha Halim, MD;  Location: [facility] ORS;  Service: Obstetrics;  Laterality: N/A;   CESAREAN SECTION N/A 09/30/2019   Procedure: CESAREAN SECTION;  Surgeon: Will Bonnet, MD;  Location: [facility] ORS;  Service: Obstetrics;  Laterality: N/A;   CESAREAN SECTION N/A 10/24/2021   Procedure: REPEAT CESAREAN SECTION;  Surgeon: Ouida Sills, Gwen Her, MD;  Location: [facility] ORS;  Service: Obstetrics;  Laterality: N/A;    Family History  Problem Relation Age of Onset   Cancer Mother    Cancer Brother        Pancreatic   Diabetes Maternal Grandmother    Stroke  Maternal Grandmother    Cancer Maternal Grandfather        Lung (smoker)    Social History   Tobacco Use   Smoking status: Never   Smokeless tobacco: Never  Substance Use Topics   Alcohol use: Not Currently    Comment: occasional    ---------------------------------------------------------------------------------------------------------------------  Scheduled Meds: Continuous Infusions: PRN Meds:.   BP 90/67   Pulse 69   Temp 97.9 F (36.6 C) (Temporal)   Resp 18   Ht 5\' 1"  (1.549 m)   Wt 220 lb (99.8 kg)   LMP  (LMP Unknown)   SpO2 98%   BMI 41.57 kg/m    BP Readings from Last 3 Encounters:  11/03/21 90/67  10/26/21 110/61  09/20/21 (!) 107/55     Wt Readings from Last 3 Encounters:  11/03/21 220 lb (99.8 kg)  10/24/21 236 lb 12.4 oz (107.4 kg)  10/17/21 238 lb (108 kg)     ----------------------------------------------------------------------------------------------------------------------  ROS Review of Systems CNS: No memory change but mild photophobia.  No other changes are noted Cardiac: No angina or palpitation Pulmonary: No shortness of breath or wheezing GI: No constipation or  diarrhea Musculoskeletal: No upper or lower extremity weakness.  No neck stiffness or rigidity or back pain  Objective:  BP 90/67   Pulse 69   Temp 97.9 F (36.6 C) (Temporal)   Resp 18   Ht 5\' 1"  (1.549 m)   Wt 220 lb (99.8 kg)   LMP  (LMP Unknown)   SpO2 98%   BMI 41.57 kg/m   Physical Exam Patient is alert and oriented cooperative compliant good historian. Pupils are equally round reactive to light extraocular muscles intact Cardiac: Regular rate and rhythm Pulmonary: Lungs are clear to auscultation Inspection of low back reveals some mild bruising approximately 3 cm circumscribed area around L4-L5 level of midline.  No evidence of any infection.  No tenderness to palpation.  No warmth to touch.  She has good strength throughout the upper and lower  extremities.  She has a negative Brudzinski and Kernig sign.  Good range of motion at the atlantooccipital joint.     Assessment & Plan:   1. Post-dural puncture headache   Based on timeline and physical findings I think her headache is likely a postdural puncture headache from clinical exam and symptom complex.  I have gone over the risks and benefits of a possible epidural blood patch with her and she would like to defer on this for the next few days to see if the headache improves with addition of ibuprofen or Tylenol being that she is not taking her Lovenox as discussed today.  I encouraged her to continue with increased oral fluid intake and caffeine and current postoperative pain medications to assist with headache management.  Should this change she is instructed to contact the pain center or anesthesia staff on-call this weekend for possible epidural blood patch.  Continue follow-up with Dr. Ouida Sills.   ----------------------------------------------------------------------------------------------------------------------  Problem List Items Addressed This Visit   None   ----------------------------------------------------------------------------------------------------------------------  There are no diagnoses linked to this encounter.   ----------------------------------------------------------------------------------------------------------------------  I am having Tammy Chase maintain her prenatal multivitamin, acetaminophen, ibuprofen, oxyCODONE, escitalopram, senna-docusate, simethicone, ferrous sulfate, coconut oil, witch hazel-glycerin, dibucaine, and enoxaparin.   No orders of the defined types were placed in this encounter.      Follow-up: No follow-ups on file.    Molli Barrows, MD 1:20 PM  The [place] practitioner database for opioid medications on this patient has been reviewed by me and my staff   Greater than 50% of the total encounter time was spent in  counseling and / or coordination of care.     This dictation was performed utilizing Systems analyst.  Please excuse any unintentional or mistaken typographical errors as a result.

## 2022-04-07 IMAGING — CR DG CHEST 2V
3 series · 3 of 3 positions shown · non-contrast
Comparison: None.

CLINICAL DATA: Cough and shortness of breath.

EXAM:
CHEST - 2 VIEW

[chest pa (1 of 2)]
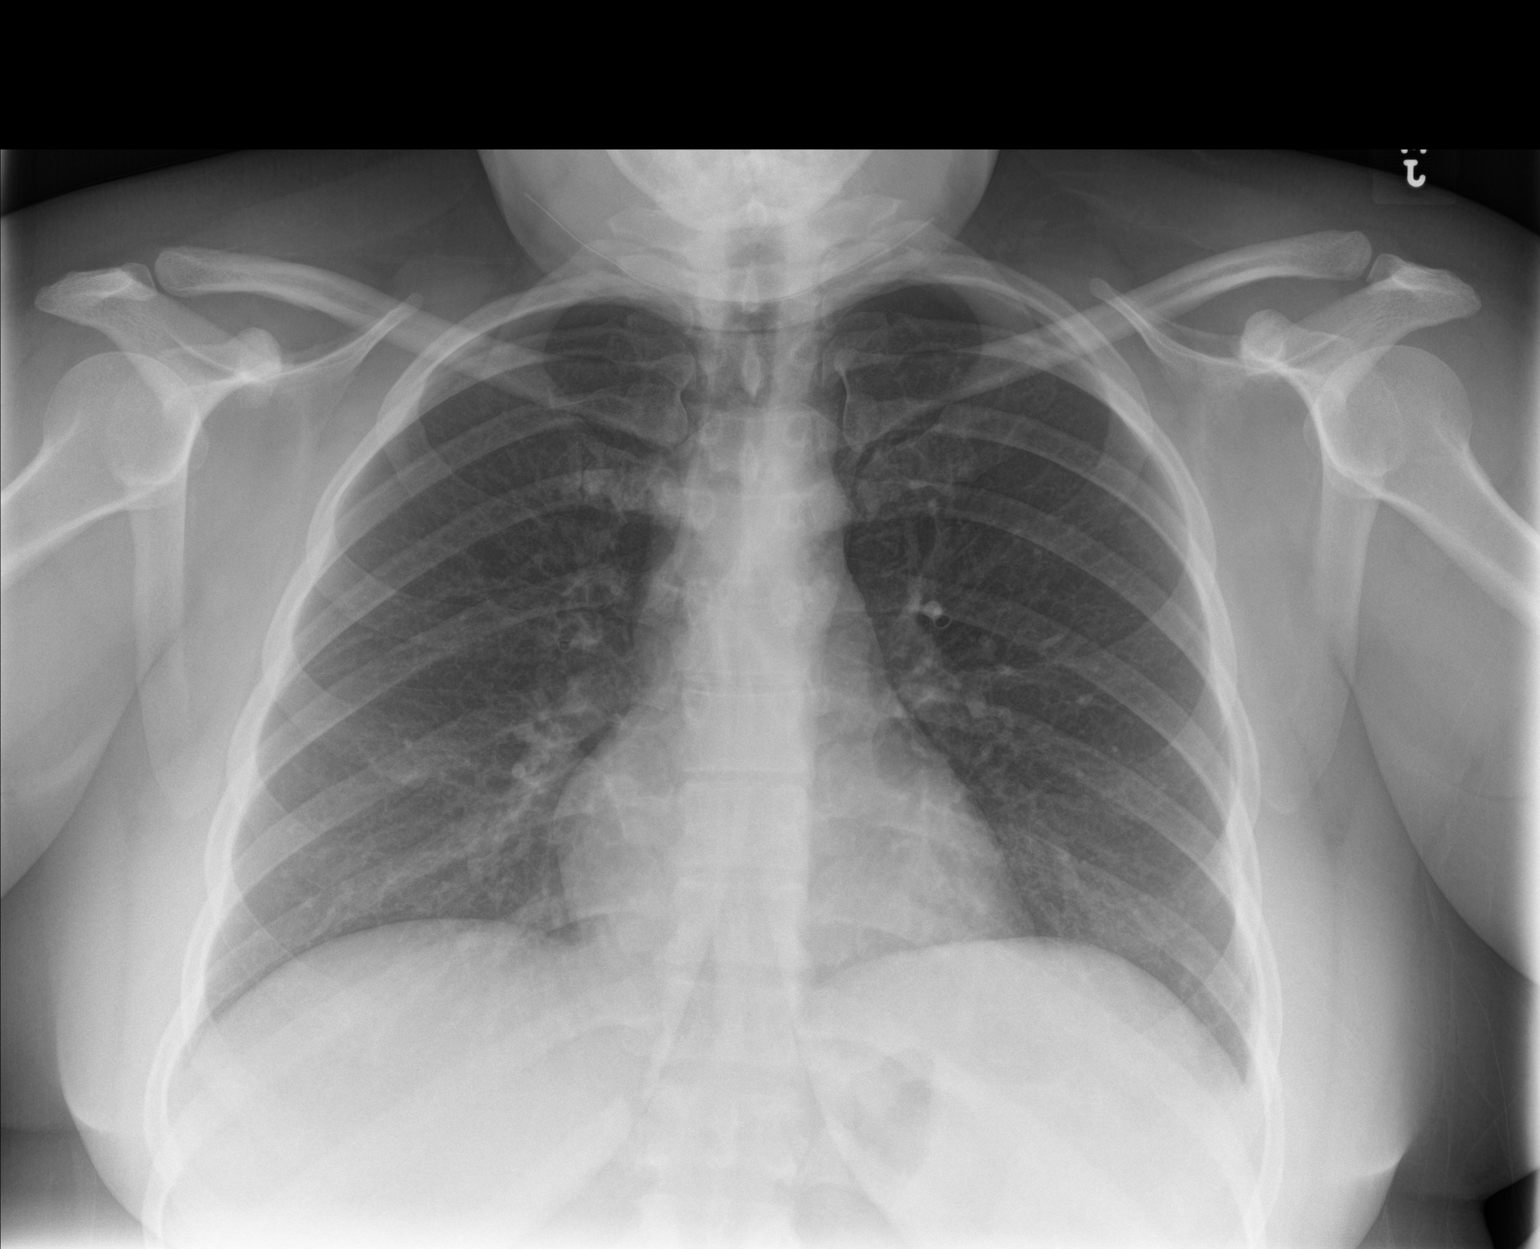

[chest lat]
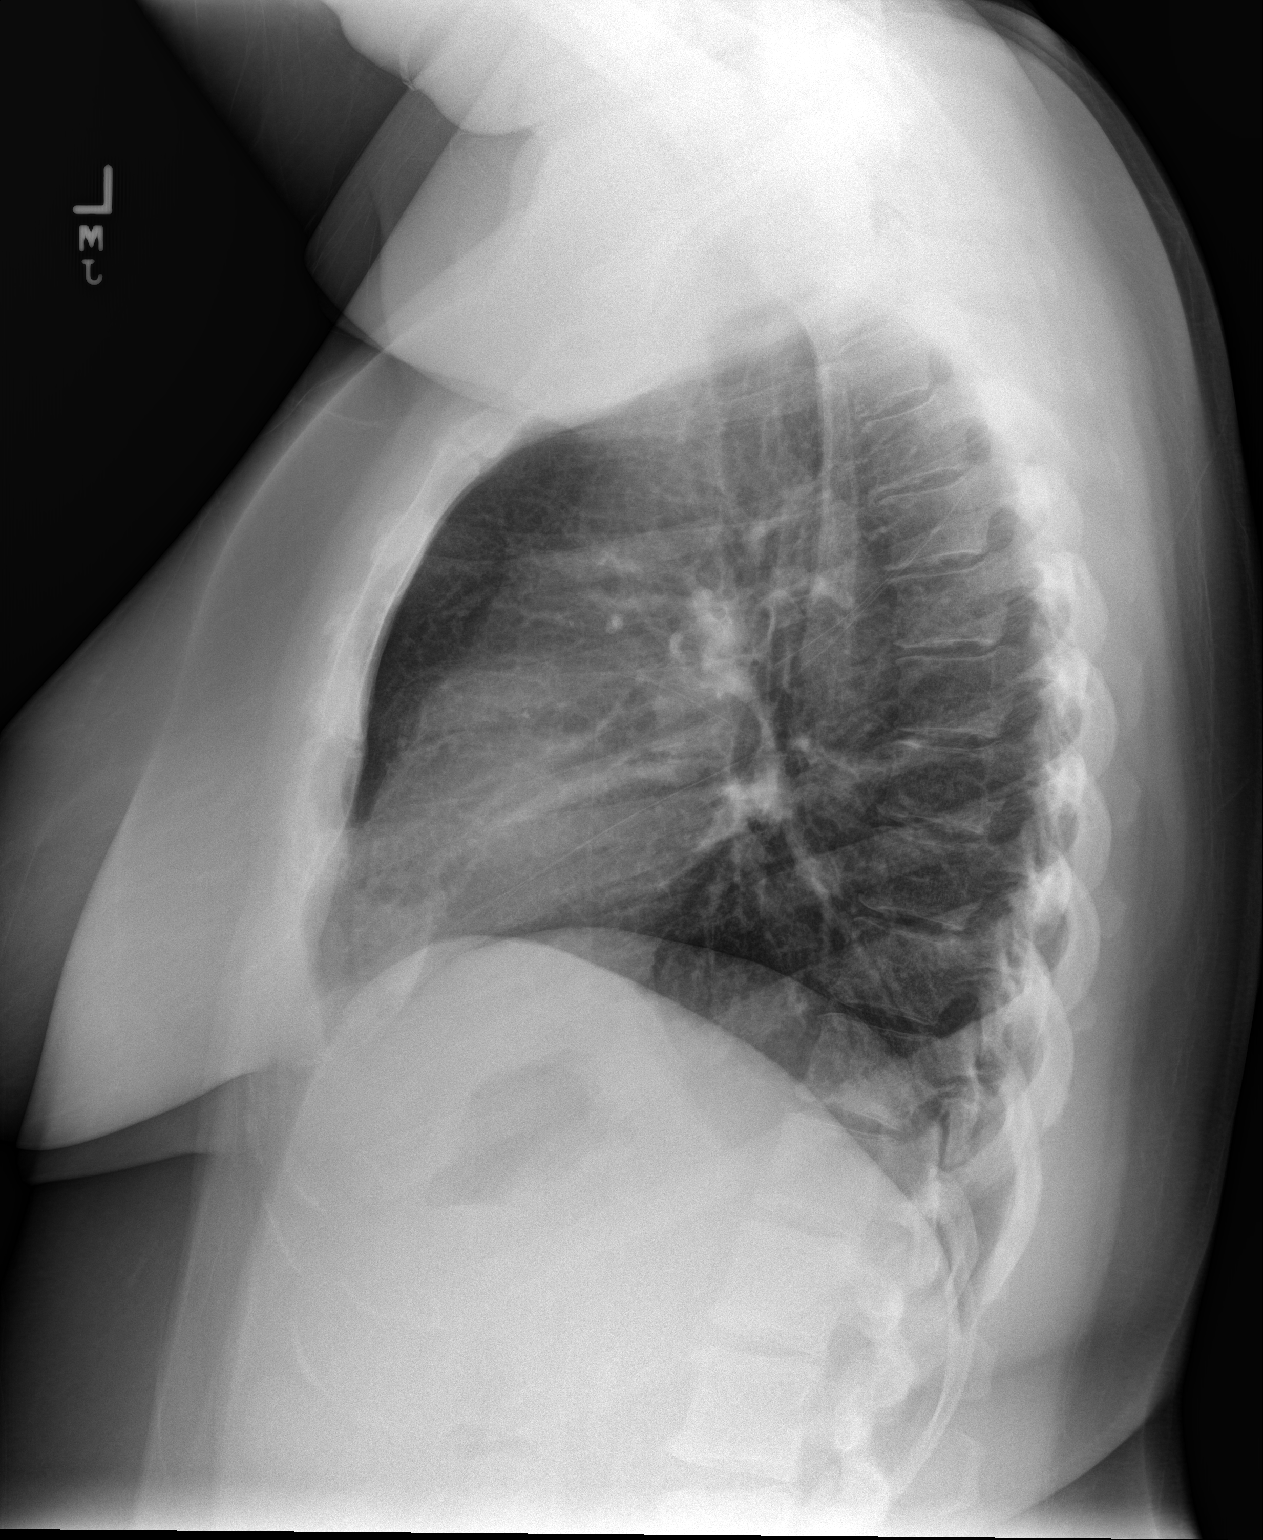

[chest pa (2 of 2)]
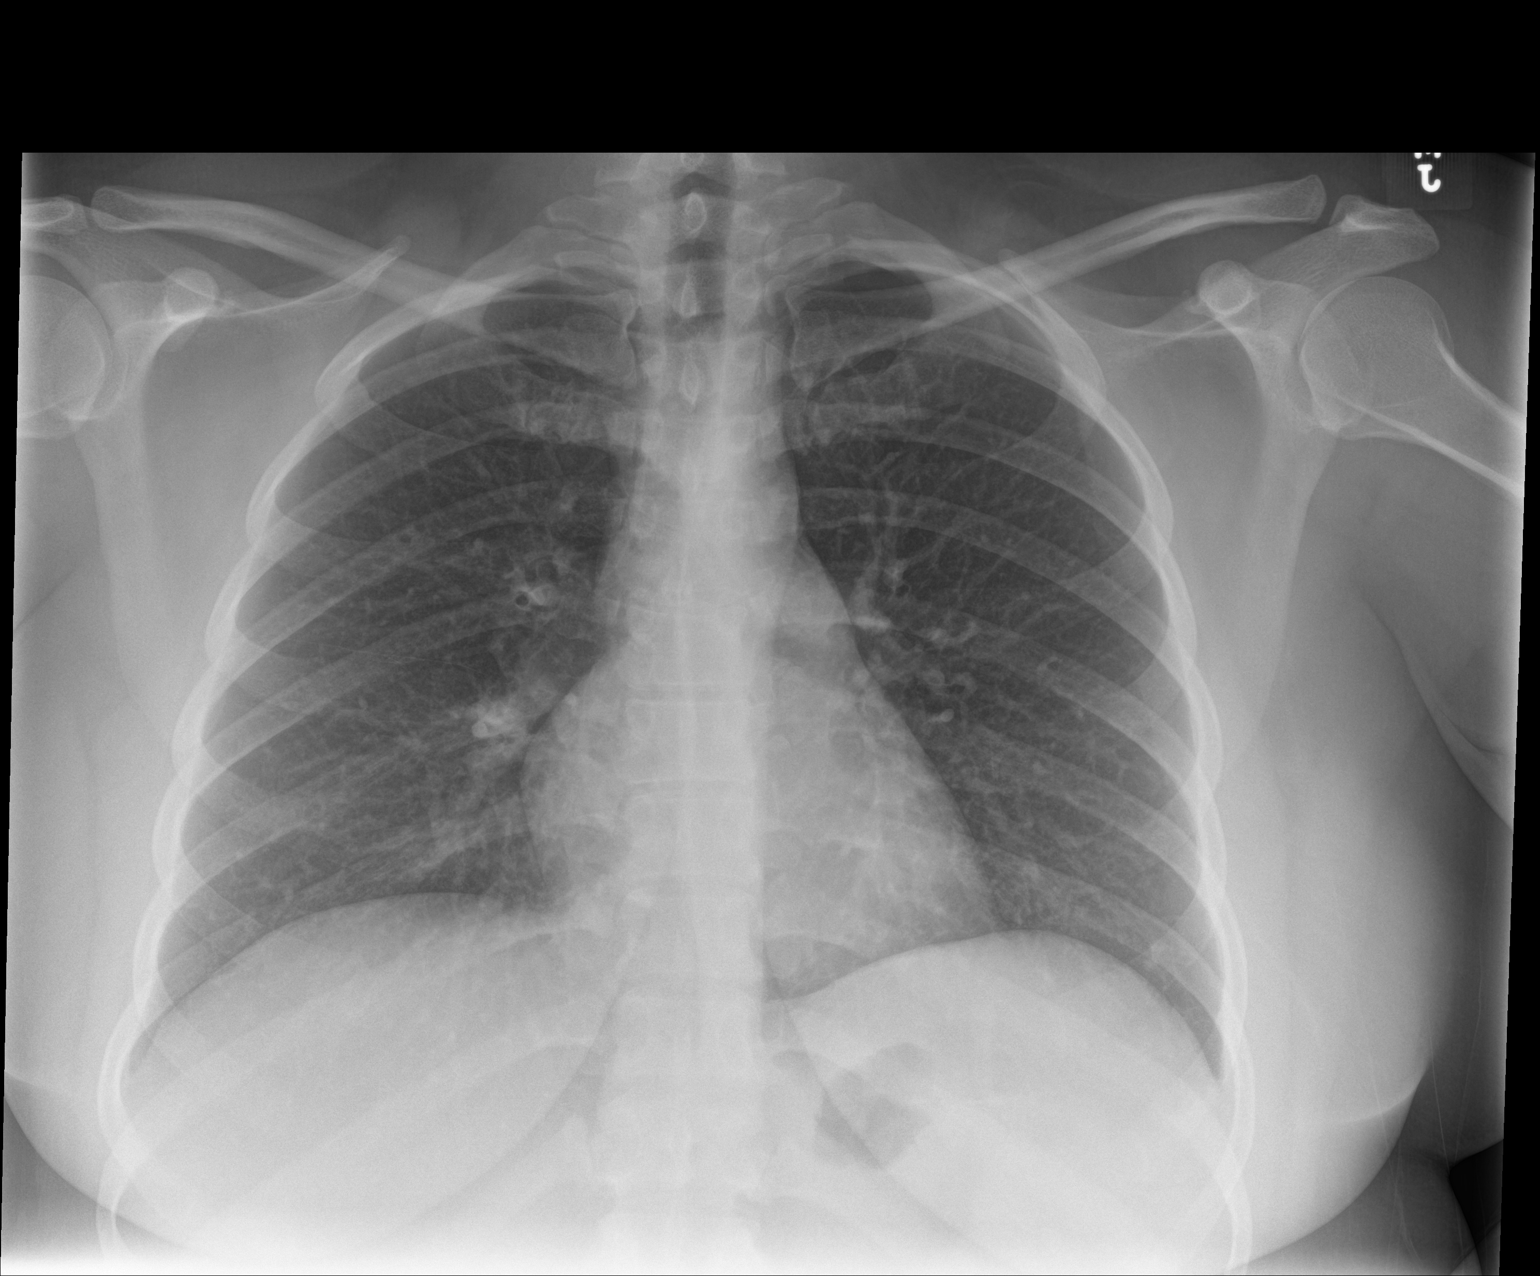

[3 of 3 positions shown; findings below may reference images not displayed]

FINDINGS: The heart size and mediastinal contours are within normal limits.
Both lungs are clear. The visualized skeletal structures are
unremarkable.
IMPRESSION: No active cardiopulmonary disease.

## 2022-05-18 IMAGING — US US MFM OB COMPLETE +14 WKS
1 series · 15 of 20 positions shown · non-contrast
Comparison: none

[Series 1: us mfm ob complete +14 wks · 20 acquisitions, 15 frames shown]
[im 1/20]
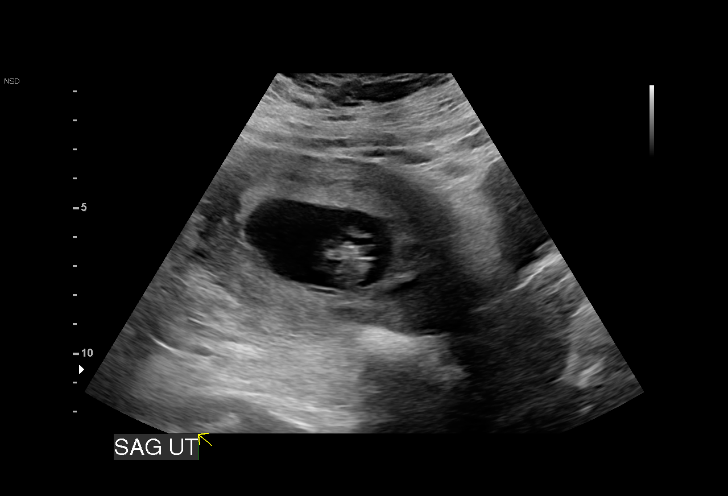
[im 3/20]
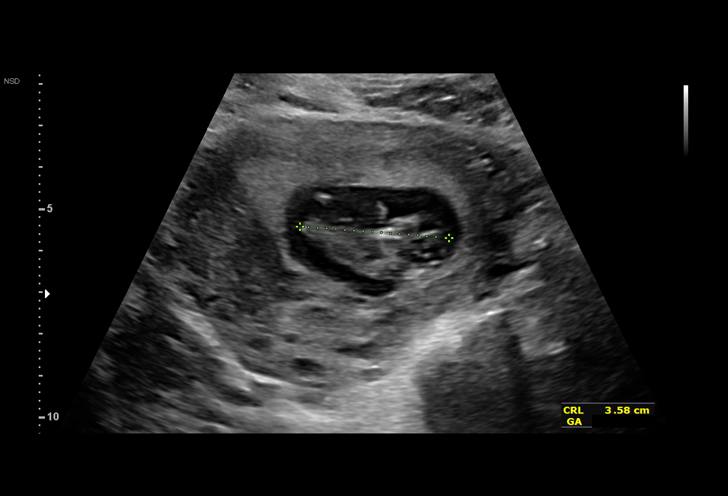
[im 4/20]
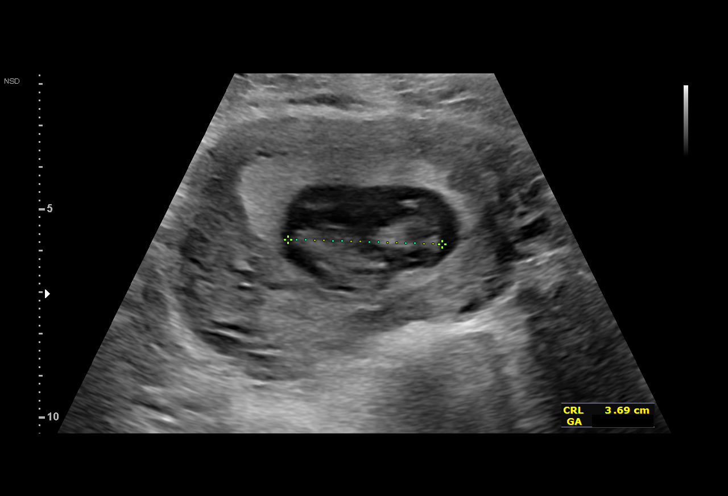
[im 5/20]
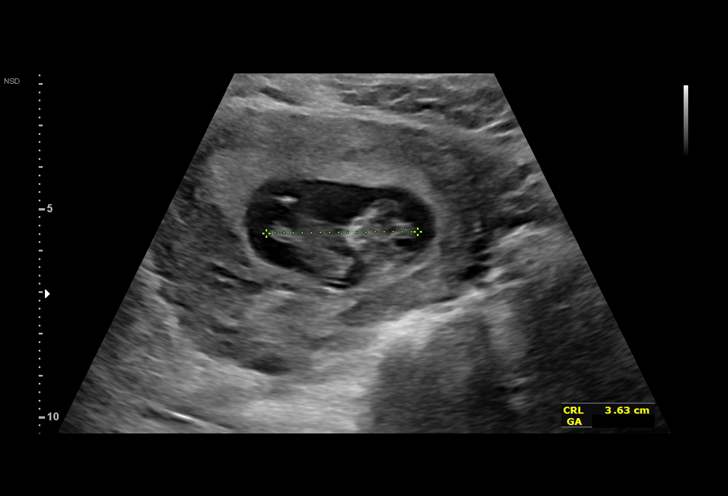
[im 7/20]
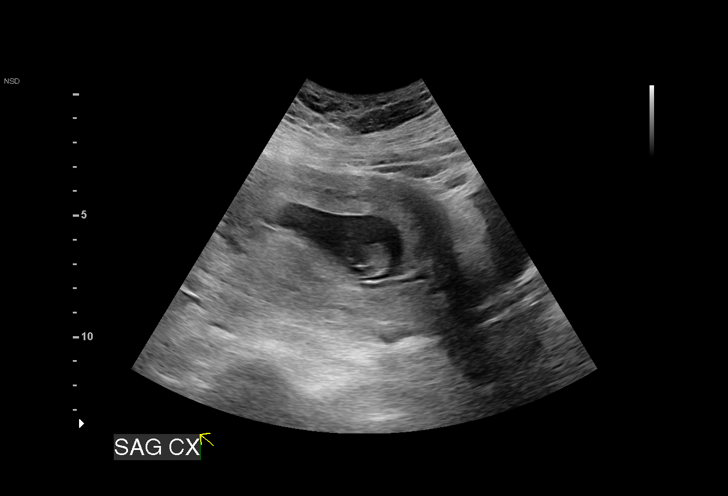
[im 8/20]
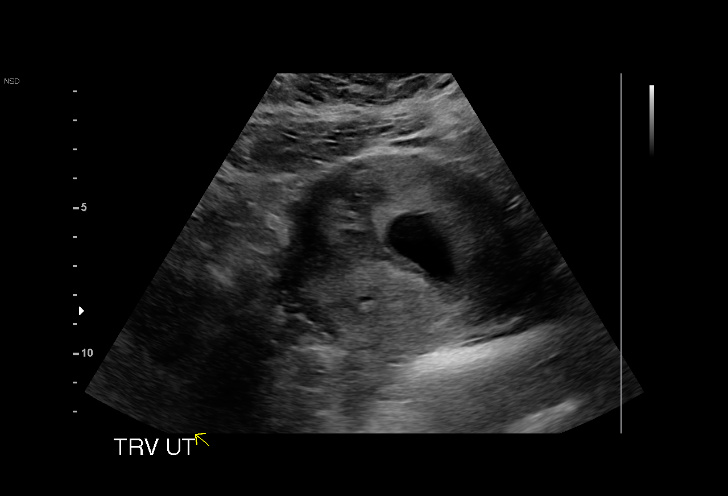
[im 9/20]
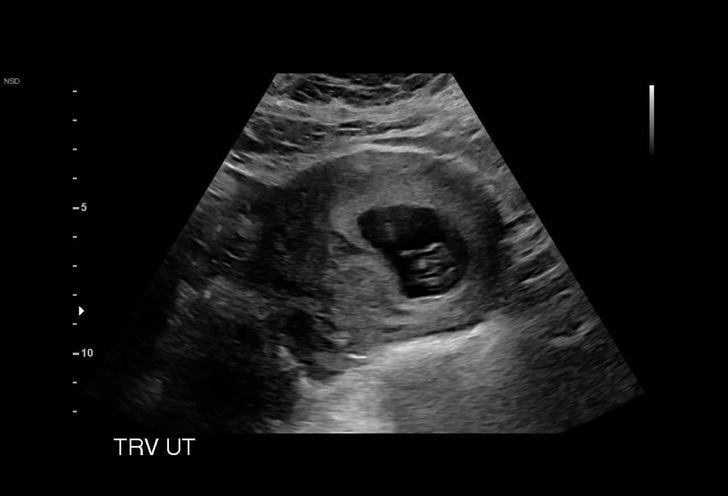
[im 11/20]
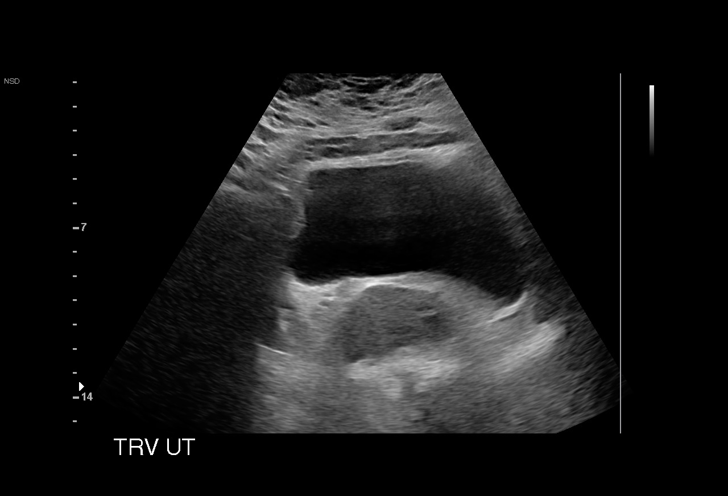
[im 12/20]
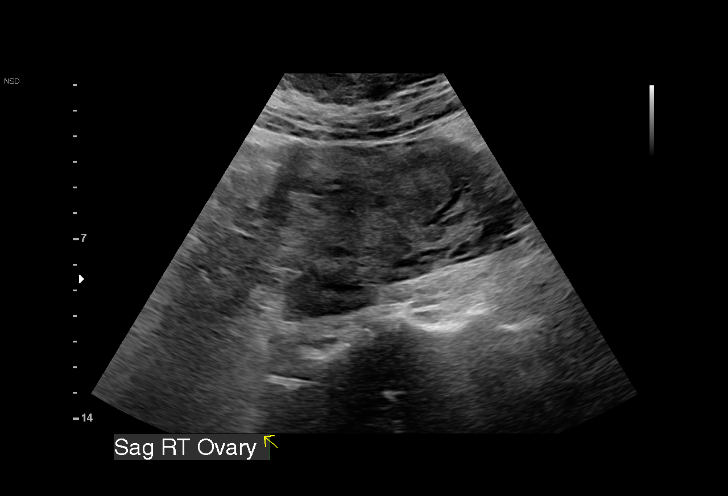
[im 13/20]
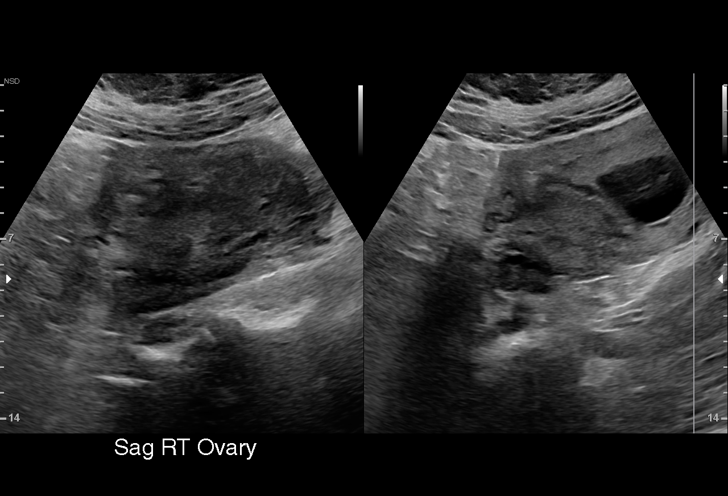
[im 15/20]
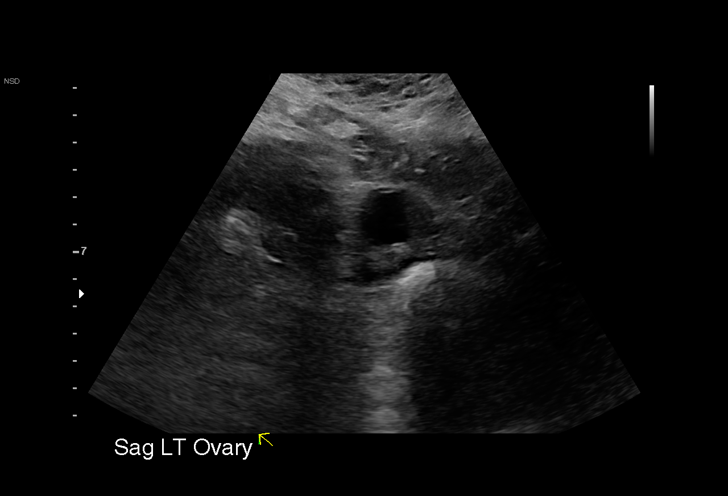
[im 16/20]
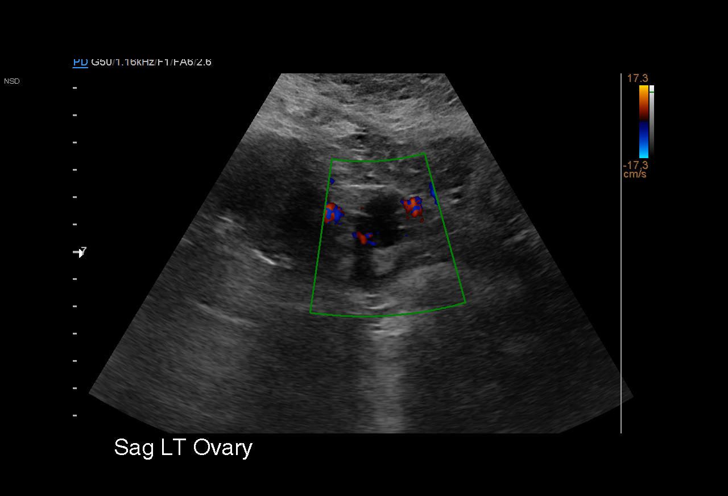
[im 17/20]
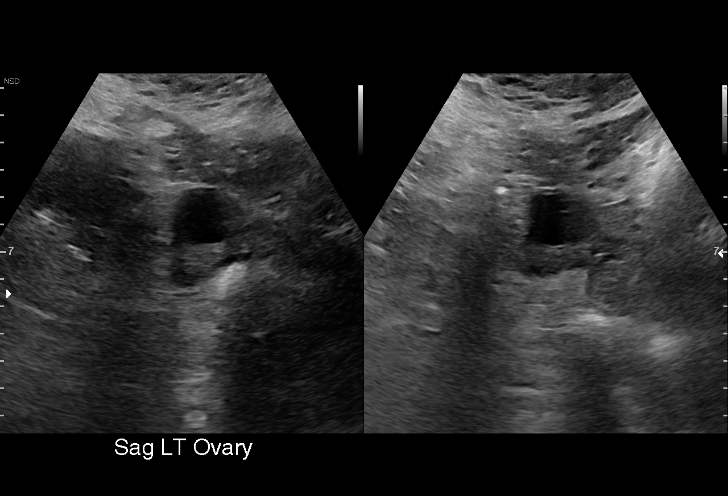
[im 19/20]
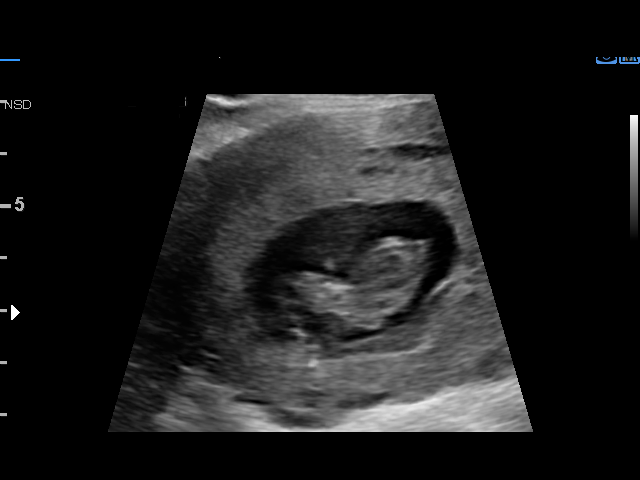
[im 20/20]
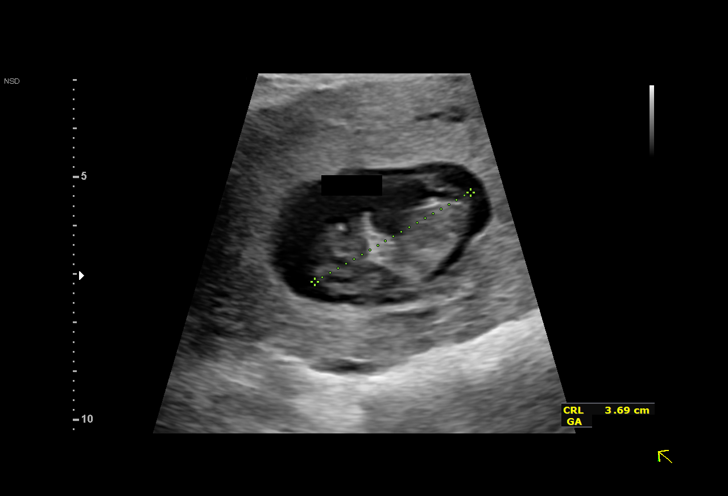

[15 of 20 positions shown; findings below may reference images not displayed]

FRYER CNM

 1  US MFM OB COMP LESS THAN              76801.4     SKIRMAN WENG
    14 WEEKS

Indications

 Medical complication of pregnancy (History
 of DVT and PE - currently on Lovenox)
 Encounter for uncertain dates
 10 weeks gestation of pregnancy
 Previous cesarean delivery, antepartum x 2
 Obesity complicating pregnancy
Fetal Evaluation

 Num Of Fetuses:         1
 Preg. Location:         Intrauterine
 Gest. Sac:              Intrauterine
 Yolk Sac:               Not visualized
 Fetal Pole:             Visualized
 Fetal Heart Rate(bpm):  166
 Cardiac Activity:       Observed
 Presentation:           Transverse, head to maternal left

 Amniotic Fluid
 AFI FV:      Within normal limits
Biometry

 CRL:      36.7  mm     G. Age:  10w 3d                  EDD:   10/30/21
OB History
 Gravidity:    3         Term:   2        Prem:   0        SAB:   0
 TOP:          0       Ectopic:  0        Living: 2
Gestational Age

 Best:          10w 3d     Det. By:  U/S C R L (04/06/21)     EDD:   10/30/21
Cervix Uterus Adnexa

 Cervix
 Closed

 Right Ovary
 Within normal limits.

 Left Ovary
 Within normal limits. Small corpus luteum noted.

 Adnexa
 No abnormality visualized.
Impression

 Single intrauterine pregnancy with measurements not
 consistent with dates

 She is dated by today's ultrasound with a new EDD of
 10/30/21.

 A detailed exam is precluded due to early gestation. No
 markers of aneuploidy were seen today.

 An MFM consultation was performed please see [REDACTED] for
 details.
Recommendations

 Detailed anatomy was scheduled in 18 weeks.

## 2022-07-13 IMAGING — US US MFM OB DETAIL+14 WK
1 series · 13 of 28 positions shown · non-contrast
Comparison: none

[Series 1: us mfm ob detail+14 wk · 127 acquisitions, 13 frames shown]
[im 5/127]
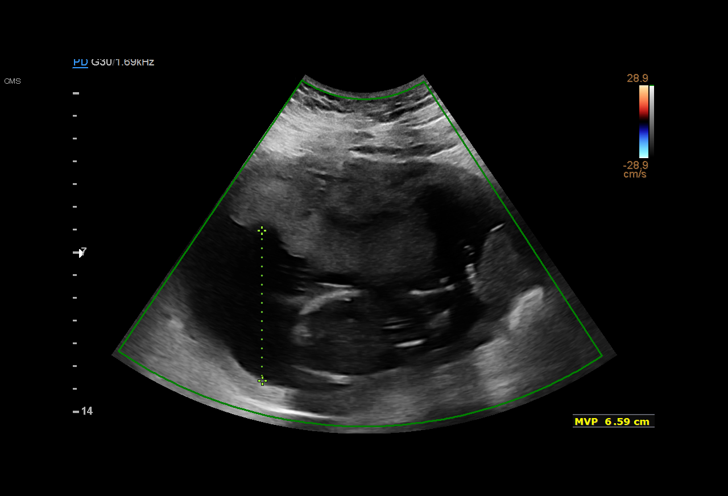
[im 15/127]
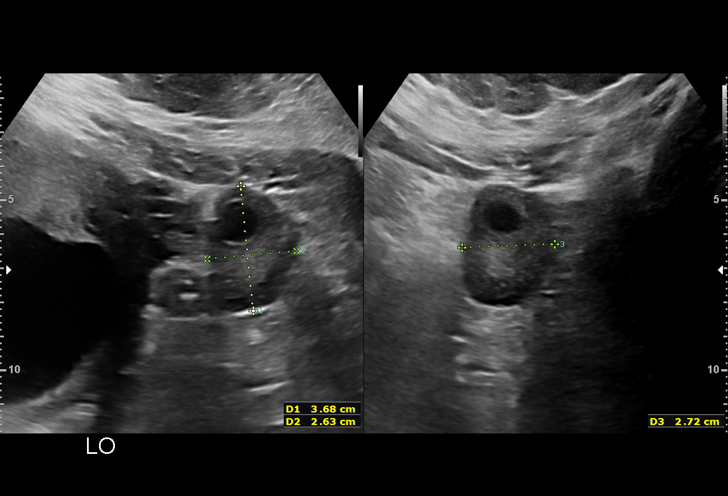
[im 24/127]
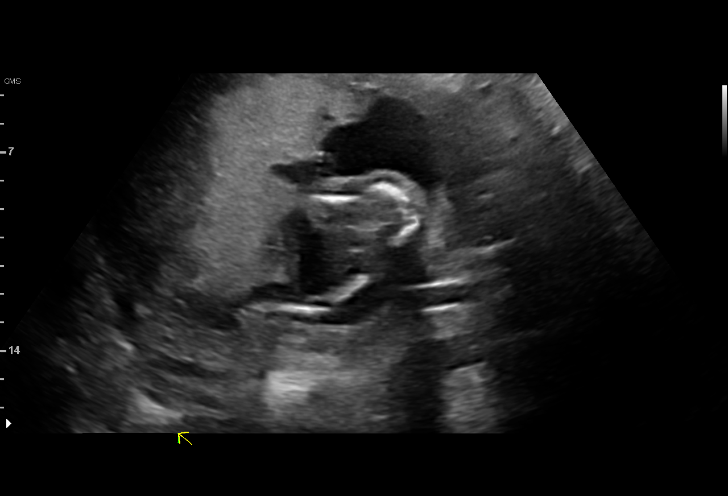
[im 33/127]
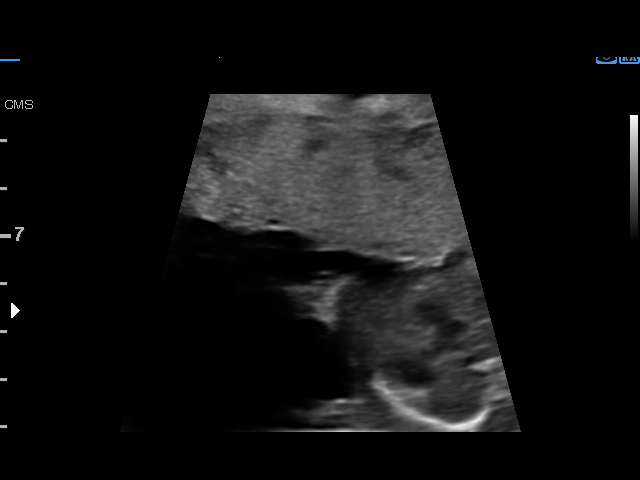
[im 43/127]
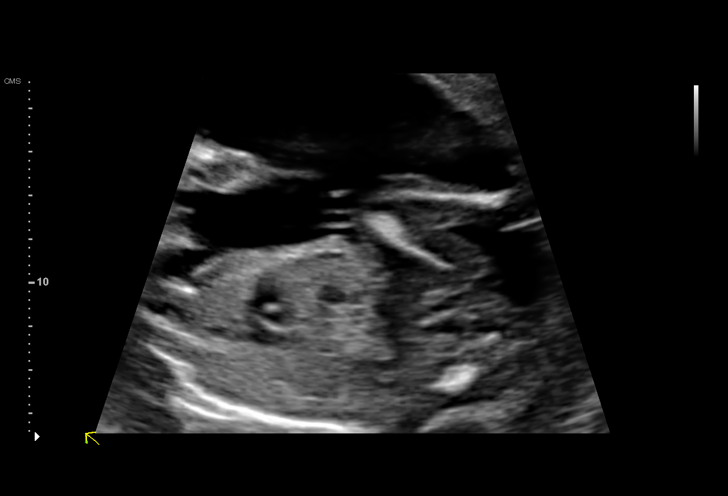
[im 52/127]
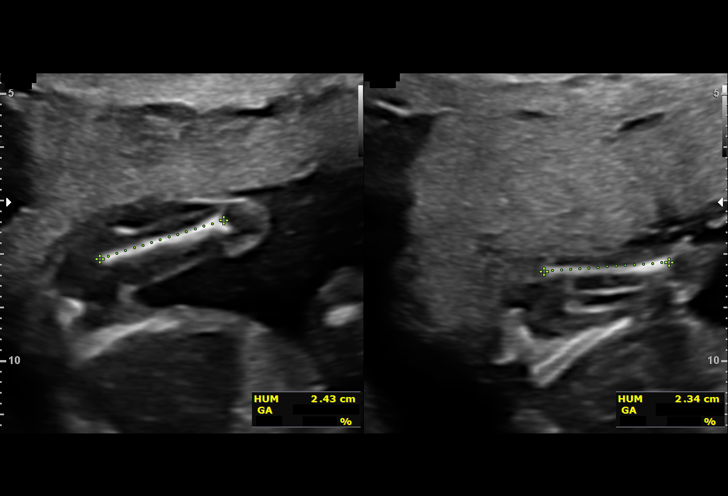
[im 66/127]
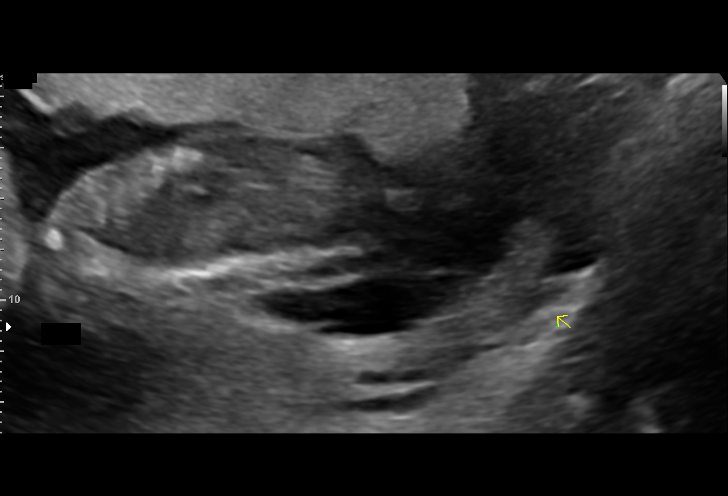
[im 75/127]
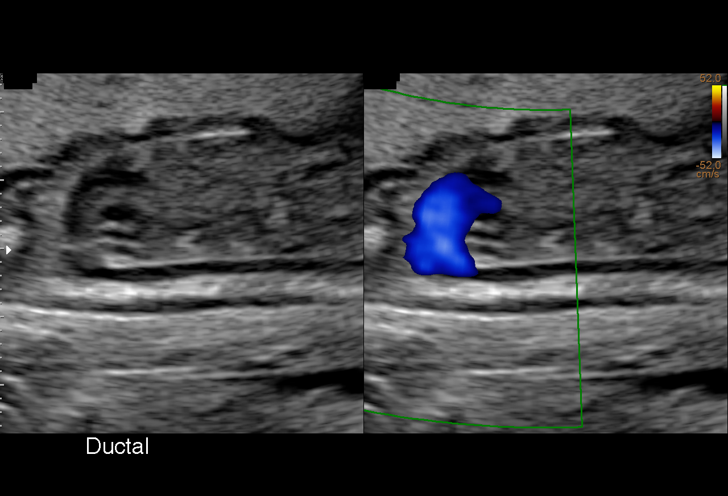
[im 85/127]
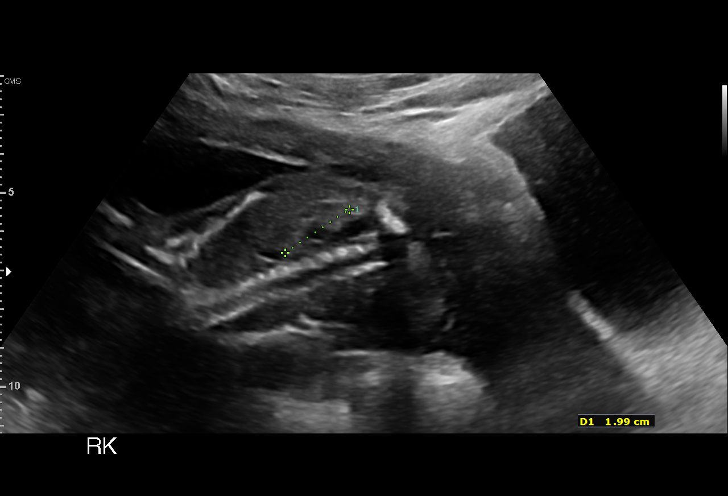
[im 94/127]
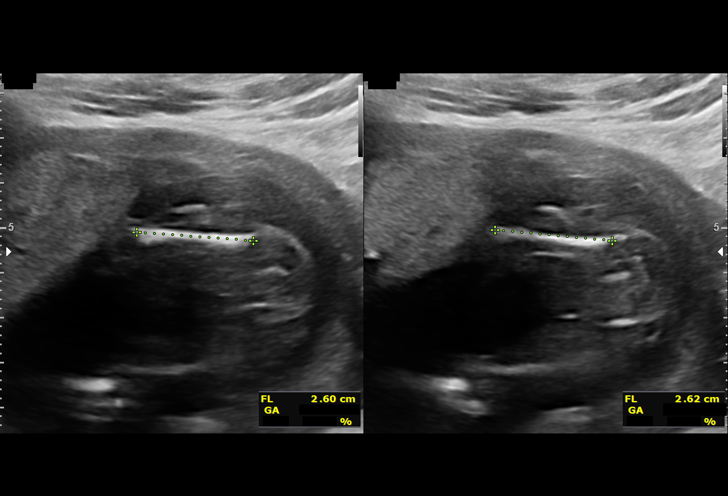
[im 103/127]
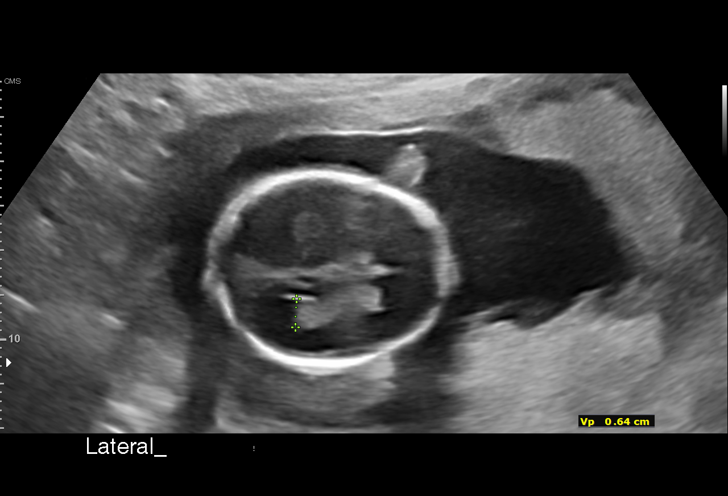
[im 113/127]
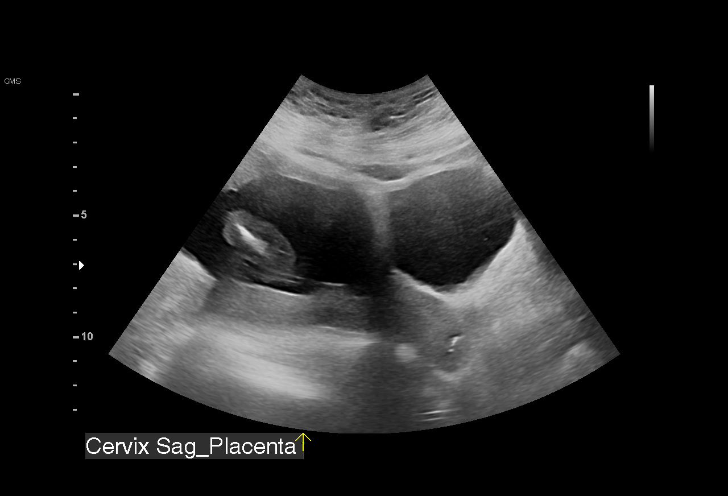
[im 122/127]
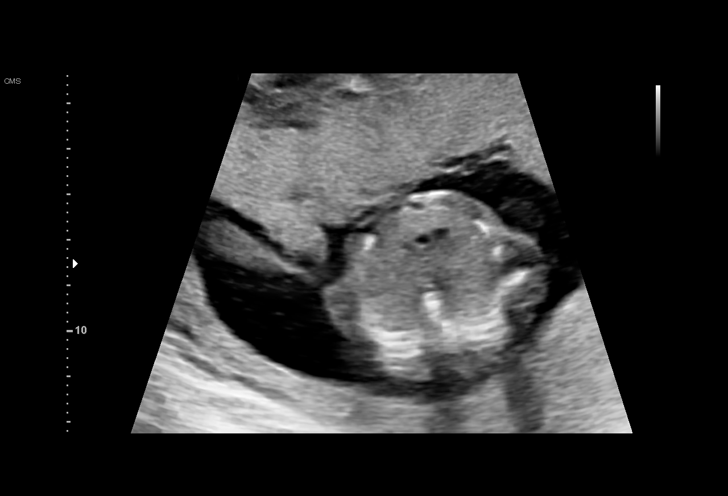

[13 of 28 positions shown; findings below may reference images not displayed]

FRYER CNM

                                                      KANE

Indications

 Medical complication of pregnancy (History
 of DVT and PE - currently on Lovenox)
 Antenatal screening for malformations
 Previous cesarean delivery, antepartum x 2
 Obesity complicating pregnancy (pregravid
 BMI 38)
 18 weeks gestation of pregnancy
 Heart disease in mother affecting pregnancy
 in second trimester (SVT)
Fetal Evaluation

 Num Of Fetuses:         1
 Fetal Heart Rate(bpm):  158
 Cardiac Activity:       Observed
 Presentation:           Transverse, head to maternal right
 Placenta:               Anterior
 P. Cord Insertion:      Visualized

 Amniotic Fluid
 AFI FV:      Within normal limits

                             Largest Pocket(cm)

Biometry
 BPD:      42.3  mm     G. Age:  18w 6d         68  %    CI:        69.55   %    70 - 86
                                                         FL/HC:      15.9   %    15.8 - 18
 HC:      161.9  mm     G. Age:  19w 0d         70  %    HC/AC:      1.19        1.07 -
 AC:      135.5  mm     G. Age:  19w 0d         66  %    FL/BPD:     61.0   %
 FL:       25.8  mm     G. Age:  17w 6d         23  %    FL/AC:      19.0   %    20 - 24
 HUM:      24.2  mm     G. Age:  17w 4d         27  %
 CER:      18.7  mm     G. Age:  18w 3d         45  %
 NFT:       4.1  mm
 LV:        6.4  mm
 CM:        5.7  mm

 Est. FW:     244  gm      0 lb 9 oz     51  %
OB History

 Gravidity:    3         Term:   2        Prem:   0        SAB:   0
 TOP:          0       Ectopic:  0        Living: 2
Gestational Age

 U/S Today:     18w 5d                                        EDD:   10/28/21
 Best:          18w 3d     Det. By:  U/S C R L  (04/06/21)    EDD:   10/30/21
Anatomy

 Cranium:               Appears normal         Aortic Arch:            Appears normal
 Cavum:                 Appears normal         Ductal Arch:            Appears normal
 Ventricles:            Appears normal         Diaphragm:              Appears normal
 Choroid Plexus:        Appears normal         Stomach:                Appears normal, left
                                                                       sided
 Cerebellum:            Appears normal         Abdomen:                Appears normal
 Posterior Fossa:       Appears normal         Abdominal Wall:         Appears nml (cord
                                                                       insert, abd wall)
 Nuchal Fold:           Appears normal         Cord Vessels:           Appears normal (3
                                                                       vessel cord)
 Face:                  Appears normal         Kidneys:                Appear normal
                        (orbits and profile)
 Lips:                  Appears normal         Bladder:                Appears normal
 Thoracic:              Appears normal         Spine:                  Limited views
                                                                       appear normal
 Heart:                 Appears normal; EIF    Upper Extremities:      Appears normal
 RVOT:                  Appears normal         Lower Extremities:      Appears normal
 LVOT:                  Appears normal

 Other:  Feet and open hands/5th digits visualized. Nasal bone, lenses,
         mandible and falx visualized Fetus appears to be a male.
Cervix Uterus Adnexa

 Cervix
 Length:           4.02  cm.
 Normal appearance by transabdominal scan.

 Uterus
 No abnormality visualized.
 Right Ovary
 Within normal limits.

 Left Ovary
 Within normal limits.

 Cul De Sac
 No free fluid seen.

 Adnexa
 No abnormality visualized.
Impression

 Single intrauterine pregnancy here for a detailed anatomy
 due to elevated BMI and history of a PE.
 Normal anatomy with measurements consistent with dates
 There is good fetal movement and amniotic fluid volume
 Suboptimal views of the fetal anatomy were obtained
 secondary to fetal position.

 Ms. Giorgi informed me that she has not taken her Lovenox
 for 3 weeks as there was a mistake with her pharmacy. She
 has not followed up but is planning to do so today.

 I reitereated the role anticoagulation in prevention of a
 pulmonary embolism. She should be on therapeutic dosing
 during the pregnancy and 6 weeks post partum.

 A prior MFM consultation was performed please see for
 details.
Recommendations

 Follow up growth in 4 weeks was scheduled.
